# Patient Record
Sex: Female | Born: 1965 | Race: Black or African American | Hispanic: No | Marital: Single | Smoking: Never smoker
Health system: Southern US, Community
[De-identification: ages and names within clinical notes are randomized; demographics above are authoritative.]

## PROBLEM LIST (undated history)

## (undated) DIAGNOSIS — K219 Gastro-esophageal reflux disease without esophagitis: Secondary | ICD-10-CM

## (undated) DIAGNOSIS — R42 Dizziness and giddiness: Secondary | ICD-10-CM

## (undated) DIAGNOSIS — F32A Depression, unspecified: Secondary | ICD-10-CM

## (undated) DIAGNOSIS — F410 Panic disorder [episodic paroxysmal anxiety] without agoraphobia: Secondary | ICD-10-CM

## (undated) DIAGNOSIS — G43909 Migraine, unspecified, not intractable, without status migrainosus: Secondary | ICD-10-CM

## (undated) HISTORY — PX: UTERINE FIBROID SURGERY: SHX826

## (undated) HISTORY — PX: DILATION AND CURETTAGE OF UTERUS: SHX78

---

## 2011-09-08 ENCOUNTER — Emergency Department (HOSPITAL_BASED_OUTPATIENT_CLINIC_OR_DEPARTMENT_OTHER)
Admission: EM | Admit: 2011-09-08 | Discharge: 2011-09-08 | Disposition: A | Discharge status: Home / Self Care | Payer: Medicaid - Out of State | Attending: Emergency Medicine | Admitting: Emergency Medicine

## 2011-09-08 ENCOUNTER — Encounter (HOSPITAL_BASED_OUTPATIENT_CLINIC_OR_DEPARTMENT_OTHER): Payer: Self-pay | Admitting: Emergency Medicine

## 2011-09-08 ENCOUNTER — Emergency Department (HOSPITAL_BASED_OUTPATIENT_CLINIC_OR_DEPARTMENT_OTHER): Payer: Medicaid - Out of State

## 2011-09-08 DIAGNOSIS — R209 Unspecified disturbances of skin sensation: Secondary | ICD-10-CM | POA: Insufficient documentation

## 2011-09-08 DIAGNOSIS — R000 Tachycardia, unspecified: Secondary | ICD-10-CM | POA: Insufficient documentation

## 2011-09-08 DIAGNOSIS — R079 Chest pain, unspecified: Secondary | ICD-10-CM

## 2011-09-08 DIAGNOSIS — F411 Generalized anxiety disorder: Secondary | ICD-10-CM | POA: Insufficient documentation

## 2011-09-08 LAB — CBC WITH DIFFERENTIAL/PLATELET
Basophils Relative: 0 % (ref 0–1)
Eosinophils Absolute: 0 10*3/uL (ref 0.0–0.7)
Eosinophils Relative: 0 % (ref 0–5)
Hemoglobin: 12.1 g/dL (ref 12.0–15.0)
Lymphs Abs: 1.4 10*3/uL (ref 0.7–4.0)
MCH: 31.5 pg (ref 26.0–34.0)
MCHC: 34.7 g/dL (ref 30.0–36.0)
MCV: 90.9 fL (ref 78.0–100.0)
Monocytes Relative: 6 % (ref 3–12)
Platelets: 353 10*3/uL (ref 150–400)
RBC: 3.84 MIL/uL — ABNORMAL LOW (ref 3.87–5.11)

## 2011-09-08 LAB — TROPONIN I: Troponin I: <0.3 ng/mL (ref <0.30)

## 2011-09-08 LAB — POCT I-STAT, CHEM 8
BUN: 9 mg/dL (ref 6–23)
Creatinine, Ser: 1 mg/dL (ref 0.50–1.10)
Potassium: 3.8 mEq/L (ref 3.5–5.1)
Sodium: 142 mEq/L (ref 135–145)

## 2011-09-08 MED ORDER — ASPIRIN 325 MG PO TABS
325.0000 mg | ORAL_TABLET | Freq: Once | ORAL | Status: AC
  Administered 2011-09-08: 325 mg via ORAL
  Filled 2011-09-08: qty 1

## 2011-09-08 MED ORDER — SODIUM CHLORIDE 0.9 % IV BOLUS (SEPSIS): 1000.0000 mL | Freq: Once | INTRAVENOUS | Status: DC

## 2011-09-08 NOTE — ED Notes (Signed)
Pt. Is resting with eyes closed when RN entered room.  Pt. Has no complaints of pain .

## 2011-09-08 NOTE — ED Notes (Signed)
Patient transported to X-ray 

## 2011-09-08 NOTE — ED Notes (Signed)
Pt states that she has been experiencing additional stress and anxiety recently.  She had a full cardiac work-up completed a year ago with no abnormal results.  She states that she is more aware of the pain when she becomes more anxious and stressed.

## 2011-09-08 NOTE — ED Notes (Signed)
Pt c/o chest pain with tingling in her left arm. Denies nausea or dizziness.  Has been experiencing chest pain intermittently for over a year.

## 2011-09-08 NOTE — ED Provider Notes (Addendum)
History     CSN: [id number]  Arrival date & time 09/08/11  0813   First MD Initiated Contact with Patient 09/08/11 [phone]      Chief Complaint  Patient presents with  . Chest Pain    (Consider location/radiation/quality/duration/timing/severity/associated sxs/prior treatment) The history is provided by the patient.   Janet Walsh is a 46 y.o. female hx of prediabetes here with chest pain. L sided chest pain intermittent for the last year. She was admitted in a hospital in [place] a year ago and had nl exercise stress test and nuclear stress test. Yesterday at night, she laid down and had L sided chest pain and palpitations. She also has numbness on L arm. Denies SOB or leg swelling. She has been traveling back and forth from [place] recently but denies hx of blood clots and is not using OCP. Her cousin died in her 35s from unknown cause while asleep but otherwise no family hx of CAD. She is not a smoker.   History reviewed. No pertinent past medical history.  Past Surgical History  Procedure Date  . Cesarean section 1998    No family history on file.  History  Substance Use Topics  . Smoking status: Never Smoker   . Smokeless tobacco: Not on file  . Alcohol Use: No    OB History    Grav Para Term Preterm Abortions TAB SAB Ect Mult Living                  Review of Systems  Cardiovascular: Positive for chest pain.  Neurological: Positive for numbness.  All other systems reviewed and are negative.    Allergies  Review of patient's allergies indicates not on file.  Home Medications  No current outpatient prescriptions on file.  BP 100/57  Pulse 72  Resp 16  SpO2 100%  LMP 08/15/2011  Physical Exam  Nursing note and vitals reviewed. Constitutional: She is oriented to person, place, and time. She appears well-developed and well-nourished.       Anxious, but NAD  HENT:  Head: Normocephalic and atraumatic.  Mouth/Throat: Oropharynx is clear and moist.    Eyes: Conjunctivae and EOM are normal. Pupils are equal, round, and reactive to light.  Neck: Normal range of motion. Neck supple.  Cardiovascular:       Tachy, no murmurs  Pulmonary/Chest: Effort normal and breath sounds normal.  Abdominal: Soft. Bowel sounds are normal.  Musculoskeletal: Normal range of motion.       No edema no calf tenderness  Neurological: She is alert and oriented to person, place, and time.  Skin: Skin is warm and dry.  Psychiatric: Her behavior is normal. Judgment and thought content normal.       Anxious     ED Course  Procedures (including critical care time)  Labs Reviewed  CBC WITH DIFFERENTIAL - Abnormal; Notable for the following:    RBC 3.84 (*)     HCT 34.9 (*)     All other components within normal limits  POCT I-STAT, CHEM 8 - Abnormal; Notable for the following:    Glucose, Bld 108 (*)     Calcium, Ion 1.26 (*)     All other components within normal limits  CK TOTAL AND CKMB  TROPONIN I  D-DIMER, QUANTITATIVE  TROPONIN I   Dg Chest 2 View  09/08/2011  *RADIOLOGY REPORT*  Clinical Data: Chest pain.  CHEST - 2 VIEW  Comparison: None.  Findings: Cardiac and mediastinal contours  appear normal.  The lungs appear clear.  No pleural effusion is identified.  IMPRESSION:  No significant abnormality identified.   Original Report Authenticated By: Dellia Cloud, M.D.      No diagnosis found.   Date: 09/08/2011  Rate: 100  Rhythm: sinus tachycardia  QRS Axis: normal  Intervals: normal  ST/T Wave abnormalities: nonspecific ST changes  Conduction Disutrbances:none  Narrative Interpretation:   Old EKG Reviewed: none available    MDM  Janet Walsh is a 46 y.o. female here with intermittent chest pain that got worse. EKG showed nonspecific changes. She is at risk for PE given her recent travels to [facility] and she is tachycardic. She is otherwise low risk for PE so d-dimer is appropriate. Will also check trop x 2. She had stress test a  year ago that is normal and she has atypical chest pain.   1:22 PM Reassessed patient. Pain free now. Labs showed CBC, Chem 8 at baseline. D-dimer neg. Trop neg x 2. Heart rate now in the 80s. Will d/c home with outpatient follow up.       Richardean Canal, MD 09/08/11 1323  Richardean Canal, MD 09/08/11 1324

## 2011-10-15 ENCOUNTER — Encounter (HOSPITAL_BASED_OUTPATIENT_CLINIC_OR_DEPARTMENT_OTHER): Payer: Self-pay | Admitting: Emergency Medicine

## 2011-10-15 ENCOUNTER — Emergency Department (HOSPITAL_BASED_OUTPATIENT_CLINIC_OR_DEPARTMENT_OTHER)
Admission: EM | Admit: 2011-10-15 | Discharge: 2011-10-15 | Disposition: A | Discharge status: Home / Self Care | Payer: Medicaid - Out of State | Attending: Emergency Medicine | Admitting: Emergency Medicine

## 2011-10-15 ENCOUNTER — Emergency Department (HOSPITAL_BASED_OUTPATIENT_CLINIC_OR_DEPARTMENT_OTHER): Payer: Medicaid - Out of State

## 2011-10-15 DIAGNOSIS — R42 Dizziness and giddiness: Secondary | ICD-10-CM

## 2011-10-15 DIAGNOSIS — R079 Chest pain, unspecified: Secondary | ICD-10-CM | POA: Insufficient documentation

## 2011-10-15 DIAGNOSIS — M542 Cervicalgia: Secondary | ICD-10-CM | POA: Insufficient documentation

## 2011-10-15 DIAGNOSIS — R51 Headache: Secondary | ICD-10-CM | POA: Insufficient documentation

## 2011-10-15 DIAGNOSIS — D649 Anemia, unspecified: Secondary | ICD-10-CM | POA: Insufficient documentation

## 2011-10-15 DIAGNOSIS — F411 Generalized anxiety disorder: Secondary | ICD-10-CM | POA: Insufficient documentation

## 2011-10-15 DIAGNOSIS — R5381 Other malaise: Secondary | ICD-10-CM | POA: Insufficient documentation

## 2011-10-15 DIAGNOSIS — R002 Palpitations: Secondary | ICD-10-CM | POA: Insufficient documentation

## 2011-10-15 DIAGNOSIS — R450 Nervousness: Secondary | ICD-10-CM | POA: Insufficient documentation

## 2011-10-15 DIAGNOSIS — R000 Tachycardia, unspecified: Secondary | ICD-10-CM | POA: Insufficient documentation

## 2011-10-15 DIAGNOSIS — K648 Other hemorrhoids: Secondary | ICD-10-CM | POA: Insufficient documentation

## 2011-10-15 DIAGNOSIS — R0602 Shortness of breath: Secondary | ICD-10-CM | POA: Insufficient documentation

## 2011-10-15 DIAGNOSIS — R209 Unspecified disturbances of skin sensation: Secondary | ICD-10-CM | POA: Insufficient documentation

## 2011-10-15 DIAGNOSIS — F419 Anxiety disorder, unspecified: Secondary | ICD-10-CM

## 2011-10-15 DIAGNOSIS — K644 Residual hemorrhoidal skin tags: Secondary | ICD-10-CM | POA: Insufficient documentation

## 2011-10-15 HISTORY — DX: Dizziness and giddiness: R42

## 2011-10-15 LAB — TROPONIN I: Troponin I: <0.3 ng/mL (ref <0.30)

## 2011-10-15 LAB — URINALYSIS, ROUTINE W REFLEX MICROSCOPIC
Glucose, UA: NEGATIVE mg/dL
Hgb urine dipstick: NEGATIVE
Specific Gravity, Urine: 1.008 (ref 1.005–1.030)
Urobilinogen, UA: 0.2 mg/dL (ref 0.0–1.0)

## 2011-10-15 LAB — CBC
Hemoglobin: 11.7 g/dL — ABNORMAL LOW (ref 12.0–15.0)
RBC: 3.73 MIL/uL — ABNORMAL LOW (ref 3.87–5.11)
WBC: 6.2 10*3/uL (ref 4.0–10.5)

## 2011-10-15 LAB — PREGNANCY, URINE: Preg Test, Ur: NEGATIVE

## 2011-10-15 LAB — BASIC METABOLIC PANEL
CO2: 26 mEq/L (ref 19–32)
Chloride: 100 mEq/L (ref 96–112)
GFR calc non Af Amer: 86 mL/min — ABNORMAL LOW (ref >90)
Glucose, Bld: 112 mg/dL — ABNORMAL HIGH (ref 70–99)
Potassium: 3.6 mEq/L (ref 3.5–5.1)
Sodium: 138 mEq/L (ref 135–145)

## 2011-10-15 LAB — OCCULT BLOOD X 1 CARD TO LAB, STOOL: Fecal Occult Bld: NEGATIVE

## 2011-10-15 NOTE — ED Notes (Signed)
Lightheaded and exhausted since getting up this morning.  Feels like heart is racing.

## 2011-10-15 NOTE — ED Provider Notes (Signed)
History     CSN: [id number]  Arrival date & time 10/15/11  1503   First MD Initiated Contact with Patient 10/15/11 1506      Chief Complaint  Patient presents with  . Dizziness  . Fatigue    (Consider location/radiation/quality/duration/timing/severity/associated sxs/prior treatment) HPI Comments: 46 y/o female presents to the ED complaining of lightheadedness and fatigue when she woke up this morning. She felt fine last night before going to bed. States she was laying in bed watching tv, and when she went to stand up she got lightheaded. Once she sat back down she felt fine. A little while later she was sitting in a chair, went to stand up and got lightheaded again, sat back down and felt fine. She has been eating and drinking well. Admits to associated fatigue when walking up steps and palpitations. Admits to being under increased stress and feeling anxious recently. Palpitations are constant as of this morning and describes it as "heart racing". She took an aspirin without any relief. She has had on and off left sided chest pain under her left breast radiating to the left side of her neck with intermittent numbness since 2011. She has had multiple workups and no one has been able to figure out what it is. States she has been diagnosed with both muscular chest pain and a pinched nerve. She is a non smoker. Has a history of vertigo and this lightheadedness is different. Normal menses.   The history is provided by the patient.    Past Medical History  Diagnosis Date  . Vertigo     Past Surgical History  Procedure Date  . Cesarean section 1998  . Dilation and curettage of uterus     No family history on file.  History  Substance Use Topics  . Smoking status: Never Smoker   . Smokeless tobacco: Not on file  . Alcohol Use: No    OB History    Grav Para Term Preterm Abortions TAB SAB Ect Mult Living                  Review of Systems  Constitutional: Positive for fatigue.  Negative for diaphoresis, activity change and appetite change.  HENT: Positive for neck pain. Negative for neck stiffness.   Eyes: Negative for visual disturbance.  Respiratory: Positive for shortness of breath. Negative for cough.   Cardiovascular: Positive for chest pain and palpitations.  Gastrointestinal: Negative for nausea, vomiting and abdominal pain.  Genitourinary: Negative for dysuria, difficulty urinating and menstrual problem.  Musculoskeletal: Negative for back pain and arthralgias.  Skin: Negative for color change and pallor.  Neurological: Positive for weakness, light-headedness and headaches. Negative for dizziness and syncope.  Psychiatric/Behavioral: Negative for confusion. The patient is nervous/anxious.     Allergies  Review of patient's allergies indicates no known allergies.  Home Medications  No current outpatient prescriptions on file.  BP 137/77  Pulse 107  Temp 99.1 F (37.3 C) (Oral)  Resp 16  Ht 5\' 4"  (1.626 m)  Wt 138 lb (62.596 kg)  BMI 23.69 kg/m2  SpO2 100%  LMP 10/07/2011  Physical Exam  Nursing note and vitals reviewed. Constitutional: She is oriented to person, place, and time. She appears well-developed and well-nourished. No distress.  HENT:  Head: Normocephalic and atraumatic.  Mouth/Throat: Oropharynx is clear and moist.  Eyes: Conjunctivae normal and EOM are normal. Pupils are equal, round, and reactive to light.  Neck: Normal range of motion. Neck supple. No spinous process  tenderness and no muscular tenderness present. No thyromegaly present.  Cardiovascular: Regular rhythm, normal heart sounds and intact distal pulses.  Tachycardia present.   Pulmonary/Chest: Effort normal and breath sounds normal.  Abdominal: Soft. Normal appearance and bowel sounds are normal. There is no tenderness. There is no CVA tenderness.  Genitourinary: Rectal exam shows external hemorrhoid (no evidence of thrombosis) and internal hemorrhoid. Rectal exam  shows no tenderness. Guaiac negative stool.  Musculoskeletal: Normal range of motion.  Neurological: She is alert and oriented to person, place, and time. She has normal strength. No cranial nerve deficit. She displays a negative Romberg sign. Coordination and gait normal.  Skin: Skin is warm, dry and intact. She is not diaphoretic. No pallor.  Psychiatric: Her speech is normal and behavior is normal. Thought content normal. Her mood appears anxious.    ED Course  Procedures (including critical care time)   Labs Reviewed  TROPONIN I  CBC  BASIC METABOLIC PANEL  URINALYSIS, ROUTINE W REFLEX MICROSCOPIC  PREGNANCY, URINE   Date: 10/15/2011  Rate: 96  Rhythm: normal sinus rhythm  QRS Axis: normal  Intervals: normal  ST/T Wave abnormalities: normal  Conduction Disutrbances:none  Narrative Interpretation: no stemi, normal ekg  Old EKG Reviewed: unchanged Results for orders placed during the hospital encounter of 10/15/11  TROPONIN I      Component Value Range   Troponin I <0.30  <0.30 ng/mL  CBC      Component Value Range   WBC 6.2  4.0 - 10.5 K/uL   RBC 3.73 (*) 3.87 - 5.11 MIL/uL   Hemoglobin 11.7 (*) 12.0 - 15.0 g/dL   HCT 16.1 (*) 09.6 - 04.5 %   MCV 92.8  78.0 - 100.0 fL   MCH 31.4  26.0 - 34.0 pg   MCHC 33.8  30.0 - 36.0 g/dL   RDW 40.9  81.1 - 91.4 %   Platelets 367  150 - 400 K/uL  BASIC METABOLIC PANEL      Component Value Range   Sodium 138  135 - 145 mEq/L   Potassium 3.6  3.5 - 5.1 mEq/L   Chloride 100  96 - 112 mEq/L   CO2 26  19 - 32 mEq/L   Glucose, Bld 112 (*) 70 - 99 mg/dL   BUN 10  6 - 23 mg/dL   Creatinine, Ser 7.82  0.50 - 1.10 mg/dL   Calcium 9.6  8.4 - 95.6 mg/dL   GFR calc non Af Amer 86 (*) >90 mL/min   GFR calc Af Amer >90  >90 mL/min  URINALYSIS, ROUTINE W REFLEX MICROSCOPIC      Component Value Range   Color, Urine YELLOW  YELLOW   APPearance CLEAR  CLEAR   Specific Gravity, Urine 1.008  1.005 - 1.030   pH 6.5  5.0 - 8.0   Glucose, UA  NEGATIVE  NEGATIVE mg/dL   Hgb urine dipstick NEGATIVE  NEGATIVE   Bilirubin Urine NEGATIVE  NEGATIVE   Ketones, ur NEGATIVE  NEGATIVE mg/dL   Protein, ur NEGATIVE  NEGATIVE mg/dL   Urobilinogen, UA 0.2  0.0 - 1.0 mg/dL   Nitrite NEGATIVE  NEGATIVE   Leukocytes, UA NEGATIVE  NEGATIVE  PREGNANCY, URINE      Component Value Range   Preg Test, Ur NEGATIVE  NEGATIVE  OCCULT BLOOD X 1 CARD TO LAB, STOOL      Component Value Range   Fecal Occult Bld NEGATIVE       Dg Chest 2 View  10/15/2011  *RADIOLOGY REPORT*  Clinical Data: Dizziness and fatigue  CHEST - 2 VIEW  Comparison: September 08, 2011  Findings:  Lungs are clear.  Heart size and pulmonary vascularity are normal.  No adenopathy.  There is minimal thoracic levoscoliosis.  IMPRESSION: Lungs clear.   Original Report Authenticated By: Arvin Collard. WOODRUFF III, M.D.      1. Lightheadedness   2. Anxiety   3. Anemia       MDM  46 y/o female with lightheadedness as of this morning. Orthostatics WNL. Unable to reciprocate symptoms. No acute findings on EKG, CXR, or labs explaining intermittent chest pain since 2011. Patient just moved here from [place] and has no PCP. Hgb lower today than in August- states she has been told she has low iron in the past. Occult stool card negative. Will discharge with instructions to take iron supplementation along with resource list for PCP follow up. Case discussed with Dr. Judd Lien who agrees with plan of care.        Trevor Mace, PA-C 10/15/11 1728

## 2011-10-15 NOTE — ED Notes (Addendum)
She reports her lightheadedness and exhausted feeling that began this morning. She feels anxious.

## 2011-10-16 NOTE — ED Provider Notes (Signed)
Medical screening examination/treatment/procedure(s) were performed by non-physician practitioner and as supervising physician I was immediately available for consultation/collaboration.  Geoffery Lyons, MD 10/16/11 1945

## 2012-03-29 ENCOUNTER — Emergency Department (HOSPITAL_BASED_OUTPATIENT_CLINIC_OR_DEPARTMENT_OTHER): Payer: Medicaid - Out of State

## 2012-03-29 ENCOUNTER — Emergency Department (HOSPITAL_BASED_OUTPATIENT_CLINIC_OR_DEPARTMENT_OTHER)
Admission: EM | Admit: 2012-03-29 | Discharge: 2012-03-30 | Disposition: A | Discharge status: Home / Self Care | Payer: Medicaid - Out of State | Attending: Emergency Medicine | Admitting: Emergency Medicine

## 2012-03-29 ENCOUNTER — Encounter (HOSPITAL_BASED_OUTPATIENT_CLINIC_OR_DEPARTMENT_OTHER): Payer: Self-pay | Admitting: *Deleted

## 2012-03-29 DIAGNOSIS — Z8669 Personal history of other diseases of the nervous system and sense organs: Secondary | ICD-10-CM | POA: Insufficient documentation

## 2012-03-29 DIAGNOSIS — F411 Generalized anxiety disorder: Secondary | ICD-10-CM | POA: Insufficient documentation

## 2012-03-29 DIAGNOSIS — R12 Heartburn: Secondary | ICD-10-CM | POA: Insufficient documentation

## 2012-03-29 DIAGNOSIS — Z79899 Other long term (current) drug therapy: Secondary | ICD-10-CM | POA: Insufficient documentation

## 2012-03-29 DIAGNOSIS — Z7982 Long term (current) use of aspirin: Secondary | ICD-10-CM | POA: Insufficient documentation

## 2012-03-29 DIAGNOSIS — Z3202 Encounter for pregnancy test, result negative: Secondary | ICD-10-CM | POA: Insufficient documentation

## 2012-03-29 DIAGNOSIS — R450 Nervousness: Secondary | ICD-10-CM | POA: Insufficient documentation

## 2012-03-29 DIAGNOSIS — F419 Anxiety disorder, unspecified: Secondary | ICD-10-CM

## 2012-03-29 DIAGNOSIS — K219 Gastro-esophageal reflux disease without esophagitis: Secondary | ICD-10-CM | POA: Insufficient documentation

## 2012-03-29 HISTORY — DX: Gastro-esophageal reflux disease without esophagitis: K21.9

## 2012-03-29 HISTORY — DX: Panic disorder (episodic paroxysmal anxiety): F41.0

## 2012-03-29 LAB — CBC WITH DIFFERENTIAL/PLATELET
Basophils Absolute: 0 10*3/uL (ref 0.0–0.1)
Eosinophils Relative: 0 % (ref 0–5)
HCT: 34.4 % — ABNORMAL LOW (ref 36.0–46.0)
Lymphocytes Relative: 28 % (ref 12–46)
Lymphs Abs: 2 10*3/uL (ref 0.7–4.0)
MCV: 93.5 fL (ref 78.0–100.0)
Monocytes Absolute: 0.8 10*3/uL (ref 0.1–1.0)
Neutro Abs: 4.2 10*3/uL (ref 1.7–7.7)
Platelets: 392 10*3/uL (ref 150–400)
RBC: 3.68 MIL/uL — ABNORMAL LOW (ref 3.87–5.11)
RDW: 13.1 % (ref 11.5–15.5)
WBC: 7 10*3/uL (ref 4.0–10.5)

## 2012-03-29 LAB — BASIC METABOLIC PANEL
CO2: 25 mEq/L (ref 19–32)
Chloride: 101 mEq/L (ref 96–112)
Glucose, Bld: 129 mg/dL — ABNORMAL HIGH (ref 70–99)
Sodium: 137 mEq/L (ref 135–145)

## 2012-03-29 LAB — TROPONIN I: Troponin I: <0.3 ng/mL (ref <0.30)

## 2012-03-29 LAB — D-DIMER, QUANTITATIVE: D-Dimer, Quant: <0.27 ug/mL-FEU (ref 0.00–0.48)

## 2012-03-29 MED ORDER — LORAZEPAM 2 MG/ML IJ SOLN
0.5000 mg | Freq: Once | INTRAMUSCULAR | Status: AC
  Administered 2012-03-29: 0.5 mg via INTRAVENOUS
  Filled 2012-03-29: qty 1

## 2012-03-29 MED ORDER — GI COCKTAIL ~~LOC~~
30.0000 mL | Freq: Once | ORAL | Status: AC
  Administered 2012-03-29: 30 mL via ORAL
  Filled 2012-03-29: qty 30

## 2012-03-29 NOTE — ED Notes (Signed)
Pt reports unable to void at this time.

## 2012-03-29 NOTE — ED Notes (Signed)
MD at bedside. 

## 2012-03-29 NOTE — ED Provider Notes (Signed)
History     CSN: [id number]  Arrival date & time 03/29/12  2243   First MD Initiated Contact with Patient 03/29/12 2259      Chief Complaint  Patient presents with  . Chest Pain    (Consider location/radiation/quality/duration/timing/severity/associated sxs/prior treatment) Patient is a 47 y.o. female presenting with chest pain. The history is provided by the patient. No language interpreter was used.  Chest Pain Pain location:  L chest Pain quality: burning   Pain radiates to:  Does not radiate Pain radiates to the back: no   Pain severity:  Moderate Onset quality:  Gradual Duration:  13 hours Timing:  Constant Progression:  Unchanged Chronicity:  Recurrent Context: stress   Relieved by:  Nothing Worsened by:  Nothing tried Ineffective treatments:  None tried Associated symptoms: no back pain, no claudication, no fever, no lower extremity edema, no nausea, no PND, no shortness of breath, not vomiting and no weakness   Risk factors: no smoking     Past Medical History  Diagnosis Date  . Vertigo   . GERD (gastroesophageal reflux disease)   . Panic attacks     Past Surgical History  Procedure Laterality Date  . Cesarean section  1998  . Dilation and curettage of uterus      History reviewed. No pertinent family history.  History  Substance Use Topics  . Smoking status: Never Smoker   . Smokeless tobacco: Not on file  . Alcohol Use: No    OB History   Grav Para Term Preterm Abortions TAB SAB Ect Mult Living                  Review of Systems  Constitutional: Negative for fever.  Respiratory: Negative for shortness of breath.   Cardiovascular: Positive for chest pain. Negative for claudication and PND.  Gastrointestinal: Negative for nausea and vomiting.  Musculoskeletal: Negative for back pain.  Neurological: Negative for weakness.  Psychiatric/Behavioral: The patient is nervous/anxious.   All other systems reviewed and are  negative.    Allergies  Review of patient's allergies indicates no known allergies.  Home Medications   Current Outpatient Rx  Name  Route  Sig  Dispense  Refill  . aspirin 81 MG tablet   Oral   Take 81 mg by mouth daily.         Marland Kitchen LORazepam (ATIVAN) 1 MG tablet   Oral   Take 1 mg by mouth every 6 (six) hours as needed for anxiety.         Marland Kitchen omeprazole (PRILOSEC) 20 MG capsule   Oral   Take 20 mg by mouth daily.           BP 149/93  Temp(Src) 98.9 F (37.2 C) (Oral)  Wt 138 lb (62.596 kg)  BMI 23.68 kg/m2  SpO2 100%  Physical Exam  Constitutional: She is oriented to person, place, and time. She appears well-developed and well-nourished. No distress.  HENT:  Mouth/Throat: Oropharynx is clear and moist.  Eyes: Conjunctivae are normal. Pupils are equal, round, and reactive to light.  Neck: Normal range of motion. Neck supple.  Cardiovascular: Normal rate, regular rhythm and intact distal pulses.   Pulmonary/Chest: Effort normal and breath sounds normal. No respiratory distress. She has no wheezes. She has no rales. She exhibits no tenderness.  Abdominal: Soft. Bowel sounds are normal. There is no tenderness. There is no rebound and no guarding.  Musculoskeletal: Normal range of motion.  Neurological: She is alert and oriented  to person, place, and time.  Skin: Skin is warm and dry. She is not diaphoretic.  Psychiatric: Her mood appears anxious.    ED Course  Procedures (including critical care time)  Labs Reviewed  CBC WITH DIFFERENTIAL  BASIC METABOLIC PANEL  TROPONIN I  D-DIMER, QUANTITATIVE   No results found.   No diagnosis found.    MDM   Date: 03/30/2012  Rate: 118  Rhythm: sinus tachycardia  QRS Axis: normal  Intervals: normal  ST/T Wave abnormalities: nonspecific ST changes  Conduction Disutrbances:none  Narrative Interpretation:   Old EKG Reviewed: changes noted  Recent negative nuc stress test.  In the setting of > 8 hours of  ongoing symptoms with negative EKG and troponin ACS is excluded.   Symptoms completely relieved with GI cocktail.  Patient not taking her prilosec.  Symptoms are consistent with GERD that patient has made worsening by panicking.  Will prescribe carafate and refer to GI.  Return for chest pain shortness of breath nausea sweatiness or any concerns.  Follow up with your family doctor patient verbalizes understanding and agrees to follow up       Teale Goodgame Smitty Cords, MD 03/30/12 [phone]

## 2012-03-29 NOTE — ED Notes (Signed)
C/o intermittent chest pain for few days. Pt denies any n/v, no SOB, or diaphoresis. Pt has been seen for same in the past and dx'd with panic attacks.

## 2012-03-30 MED ORDER — SUCRALFATE 1 GM/10ML PO SUSP: 1.0000 g | Freq: Four times a day (QID) | ORAL | Status: DC

## 2012-03-30 MED ORDER — SODIUM CHLORIDE 0.9 % IV BOLUS (SEPSIS)
500.0000 mL | Freq: Once | INTRAVENOUS | Status: AC
  Administered 2012-03-29: 500 mL via INTRAVENOUS

## 2012-03-30 NOTE — ED Notes (Signed)
MD at bedside. 

## 2012-03-30 NOTE — ED Notes (Signed)
Patient transported to X-ray 

## 2013-08-13 ENCOUNTER — Ambulatory Visit (HOSPITAL_COMMUNITY)
Admission: RE | Admit: 2013-08-13 | Discharge: 2013-08-13 | Disposition: A | Discharge status: Home / Self Care | Payer: 59 | Source: Ambulatory Visit | Attending: Emergency Medicine | Admitting: Emergency Medicine

## 2013-08-22 ENCOUNTER — Emergency Department (HOSPITAL_BASED_OUTPATIENT_CLINIC_OR_DEPARTMENT_OTHER)
Admission: EM | Admit: 2013-08-22 | Discharge: 2013-08-22 | Disposition: A | Discharge status: Home / Self Care | Payer: 59 | Attending: Emergency Medicine | Admitting: Emergency Medicine

## 2013-08-22 ENCOUNTER — Encounter (HOSPITAL_BASED_OUTPATIENT_CLINIC_OR_DEPARTMENT_OTHER): Payer: Self-pay | Admitting: Emergency Medicine

## 2013-08-22 DIAGNOSIS — R109 Unspecified abdominal pain: Secondary | ICD-10-CM | POA: Insufficient documentation

## 2013-08-22 DIAGNOSIS — K219 Gastro-esophageal reflux disease without esophagitis: Secondary | ICD-10-CM | POA: Insufficient documentation

## 2013-08-22 DIAGNOSIS — R110 Nausea: Secondary | ICD-10-CM | POA: Diagnosis not present

## 2013-08-22 DIAGNOSIS — F410 Panic disorder [episodic paroxysmal anxiety] without agoraphobia: Secondary | ICD-10-CM | POA: Insufficient documentation

## 2013-08-22 DIAGNOSIS — Z79899 Other long term (current) drug therapy: Secondary | ICD-10-CM | POA: Insufficient documentation

## 2013-08-22 DIAGNOSIS — R197 Diarrhea, unspecified: Secondary | ICD-10-CM | POA: Diagnosis not present

## 2013-08-22 DIAGNOSIS — Z3202 Encounter for pregnancy test, result negative: Secondary | ICD-10-CM | POA: Insufficient documentation

## 2013-08-22 LAB — CBC WITH DIFFERENTIAL/PLATELET
Basophils Absolute: 0 10*3/uL (ref 0.0–0.1)
Basophils Relative: 0 % (ref 0–1)
Eosinophils Absolute: 0 10*3/uL (ref 0.0–0.7)
Eosinophils Relative: 0 % (ref 0–5)
HCT: 35.6 % — ABNORMAL LOW (ref 36.0–46.0)
HEMOGLOBIN: 11.9 g/dL — AB (ref 12.0–15.0)
LYMPHS ABS: 0.7 10*3/uL (ref 0.7–4.0)
Lymphocytes Relative: 10 % — ABNORMAL LOW (ref 12–46)
MCH: 31.5 pg (ref 26.0–34.0)
MCHC: 33.4 g/dL (ref 30.0–36.0)
MCV: 94.2 fL (ref 78.0–100.0)
MONOS PCT: 9 % (ref 3–12)
Monocytes Absolute: 0.6 10*3/uL (ref 0.1–1.0)
NEUTROS ABS: 5.5 10*3/uL (ref 1.7–7.7)
NEUTROS PCT: 81 % — AB (ref 43–77)
PLATELETS: 336 10*3/uL (ref 150–400)
RBC: 3.78 MIL/uL — AB (ref 3.87–5.11)
RDW: 14 % (ref 11.5–15.5)
WBC: 6.7 10*3/uL (ref 4.0–10.5)

## 2013-08-22 LAB — URINALYSIS, ROUTINE W REFLEX MICROSCOPIC
BILIRUBIN URINE: NEGATIVE
GLUCOSE, UA: NEGATIVE mg/dL
KETONES UR: NEGATIVE mg/dL
Leukocytes, UA: NEGATIVE
Nitrite: NEGATIVE
PH: 6 (ref 5.0–8.0)
Protein, ur: NEGATIVE mg/dL
Specific Gravity, Urine: 1.023 (ref 1.005–1.030)
Urobilinogen, UA: 0.2 mg/dL (ref 0.0–1.0)

## 2013-08-22 LAB — URINE MICROSCOPIC-ADD ON

## 2013-08-22 LAB — COMPREHENSIVE METABOLIC PANEL
ALBUMIN: 3.7 g/dL (ref 3.5–5.2)
ALK PHOS: 40 U/L (ref 39–117)
ALT: 10 U/L (ref 0–35)
ANION GAP: 15 (ref 5–15)
AST: 19 U/L (ref 0–37)
BILIRUBIN TOTAL: 0.3 mg/dL (ref 0.3–1.2)
BUN: 6 mg/dL (ref 6–23)
CHLORIDE: 101 meq/L (ref 96–112)
CO2: 22 mEq/L (ref 19–32)
Calcium: 9.7 mg/dL (ref 8.4–10.5)
Creatinine, Ser: 0.8 mg/dL (ref 0.50–1.10)
GFR calc Af Amer: >90 mL/min (ref >90)
GFR calc non Af Amer: 85 mL/min — ABNORMAL LOW (ref >90)
GLUCOSE: 128 mg/dL — AB (ref 70–99)
POTASSIUM: 3.8 meq/L (ref 3.7–5.3)
SODIUM: 138 meq/L (ref 137–147)
Total Protein: 7.9 g/dL (ref 6.0–8.3)

## 2013-08-22 LAB — PREGNANCY, URINE: Preg Test, Ur: NEGATIVE

## 2013-08-22 LAB — LIPASE, BLOOD: Lipase: 21 U/L (ref 11–59)

## 2013-08-22 MED ORDER — SODIUM CHLORIDE 0.9 % IV BOLUS (SEPSIS)
1000.0000 mL | Freq: Once | INTRAVENOUS | Status: AC
  Administered 2013-08-22: 1000 mL via INTRAVENOUS

## 2013-08-22 MED ORDER — ONDANSETRON HCL 4 MG/2ML IJ SOLN
4.0000 mg | Freq: Once | INTRAMUSCULAR | Status: AC
  Administered 2013-08-22: 4 mg via INTRAVENOUS
  Filled 2013-08-22: qty 2

## 2013-08-22 NOTE — ED Provider Notes (Signed)
CSN: [id number]     Arrival date & time 08/22/13  1937 History   First MD Initiated Contact with Patient 08/22/13 2149     This chart was scribed for Ethelda ChickMartha K Linker, MD by Arlan OrganAshley Leger, ED Scribe. This patient was seen in room MH06/MH06 and the patient's care was started 10:21 PM.   Chief Complaint  Patient presents with  . Diarrhea   The history is provided by the patient. No language interpreter was used.    HPI Comments: Janet Walsh is a 48 y.o. Female with a PMHx of GERD who presents to the Emergency Department complaining of intermittent, moderate diarrhea onset yesterday around 2:30 PM that is unchanged. She reports about 3 epsisodes of diarrhea today. Pt also reports associated intermittent, moderate abdominal cramping and nausea lasting 60 seconds with episodes. She has tried drinking some Gatorade and a dose of pepto bismol without any improvement for symptoms. At this time she denies any fever or chills. [place]. States she unaware of anyone she was traveling with mentioning similar symptoms. No known allergies to medications. No other concerns this visit. No feverchills.  No blood or mucous in stools.  No dizziness or fainting.  Denies urinary symptoms.  There are no other associated systemic symptoms, there are no other alleviating or modifying factors.   Past Medical History  Diagnosis Date  . Vertigo   . GERD (gastroesophageal reflux disease)   . Panic attacks    Past Surgical History  Procedure Laterality Date  . Cesarean section  1998  . Dilation and curettage of uterus     No family history on file. History  Substance Use Topics  . Smoking status: Never Smoker   . Smokeless tobacco: Not on file  . Alcohol Use: No   OB History   Grav Para Term Preterm Abortions TAB SAB Ect Mult Living                 Review of Systems  Constitutional: Negative for fever and chills.  Gastrointestinal: Positive for  nausea, abdominal pain and diarrhea. Negative for vomiting.   ROS reviewed and all otherwise negative except for mentioned in HPI   Allergies  Review of patient's allergies indicates no known allergies.  Home Medications   Prior to Admission medications   Medication Sig Start Date End Date Taking? Authorizing Provider  aspirin 81 MG tablet Take 81 mg by mouth daily.    Historical Provider, MD  LORazepam (ATIVAN) 1 MG tablet Take 1 mg by mouth every 6 (six) hours as needed for anxiety.    Historical Provider, MD  omeprazole (PRILOSEC) 20 MG capsule Take 20 mg by mouth daily.    Historical Provider, MD  sucralfate (CARAFATE) 1 GM/10ML suspension Take 10 mLs (1 g total) by mouth 4 (four) times daily. 03/30/12   April K Palumbo-Rasch, MD   Triage Vitals: BP 110/67  Pulse 84  Temp(Src) 98.5 F (36.9 C) (Oral)  Resp 16  Ht 5\' 4"  (1.626 m)  Wt 140 lb (63.504 kg)  BMI 24.02 kg/m2  SpO2 100%  LMP 08/07/2013   Physical Exam  Nursing note and vitals reviewed. Constitutional: She is oriented to person, place, and time. She appears well-developed and well-nourished.  HENT:  Head: Normocephalic.  Eyes: EOM are normal.  Neck: Normal range of motion.  Pulmonary/Chest: Effort normal.  Abdominal: She exhibits no distension.  Musculoskeletal: Normal range of motion.  Neurological: She is  alert and oriented to person, place, and time.  Psychiatric: She has a normal mood and affect.  note- MMM, no conjunctival injection, no scleral icterus, abdomen- nontender, nondistended, nabs, skin- no rash, normal skin turgor  ED Course  Procedures (including critical care time)  DIAGNOSTIC STUDIES: Oxygen Saturation is 100% on RA, Normal by my interpretation.    COORDINATION OF CARE: 10:27 PM- Will order stool culture, CBC, CMP, Lipase, urinalysis, and pregnancy urine. Discussed treatment plan with pt at bedside and pt agreed to plan.     Labs Review Labs Reviewed  URINALYSIS, ROUTINE W REFLEX  MICROSCOPIC - Abnormal; Notable for the following:    APPearance CLOUDY (*)    Hgb urine dipstick LARGE (*)    All other components within normal limits  URINE MICROSCOPIC-ADD ON - Abnormal; Notable for the following:    Squamous Epithelial / LPF FEW (*)    Bacteria, UA FEW (*)    All other components within normal limits  CBC WITH DIFFERENTIAL - Abnormal; Notable for the following:    RBC 3.78 (*)    Hemoglobin 11.9 (*)    HCT 35.6 (*)    Neutrophils Relative % 81 (*)    Lymphocytes Relative 10 (*)    All other components within normal limits  COMPREHENSIVE METABOLIC PANEL - Abnormal; Notable for the following:    Glucose, Bld 128 (*)    GFR calc non Af Amer 85 (*)    All other components within normal limits  STOOL CULTURE  OVA AND PARASITE EXAMINATION  URINE CULTURE  PREGNANCY, URINE  LIPASE, BLOOD    Imaging Review No results found.   EKG Interpretation None      MDM   Final diagnoses:  Diarrhea    Pt presenting with c/o onset of diarrhea with nausea which began yesterday.  Stools are liquid without blood or mucous.  She did recently travel from [place].  Urine reasssuring.  She was not able to provide stool culture- given specimen cup for home collection and will bring back tomorrow.  She feels improved after NS bolus.  Will hold on abx for now- await stool culture.   Patient is overall nontoxic and well hydrated in appearance.  Discharged with strict return precautions.  Pt agreeable with plan.  Nursing notes including past medical history and social history reviewed and considered in documentation    I personally performed the services described in this documentation, which was scribed in my presence. The recorded information has been reviewed and is accurate.    Ethelda Chick, MD 08/23/13 [phone]

## 2013-08-22 NOTE — ED Notes (Signed)
Returned from [place] today-c/o diarrhea, and abd cramps, nausea

## 2013-08-22 NOTE — Discharge Instructions (Signed)
Return to the ED with any concerns including vomiting and not able to keep down liquids, worsening abdominal pain- especially in the right lower abdomen, fever/chills, blood in stool, decreased level of alertness/lethargy, or any other alarming symptoms  You should bring a stool sample to the lab tomorrow for stool culture and stool for Ova and parasites

## 2013-08-23 DIAGNOSIS — R110 Nausea: Secondary | ICD-10-CM | POA: Diagnosis not present

## 2013-08-23 DIAGNOSIS — R197 Diarrhea, unspecified: Secondary | ICD-10-CM | POA: Diagnosis not present

## 2013-08-23 DIAGNOSIS — R109 Unspecified abdominal pain: Secondary | ICD-10-CM | POA: Diagnosis not present

## 2013-08-24 LAB — URINE CULTURE

## 2013-08-25 LAB — OVA AND PARASITE EXAMINATION

## 2013-08-27 LAB — STOOL CULTURE

## 2014-05-03 ENCOUNTER — Telehealth: Payer: Self-pay | Admitting: Interventional Cardiology

## 2014-05-03 NOTE — Telephone Encounter (Signed)
Returned pt call. Pt is requesting a new pt appt with Dr.Smith only as recommended by Okey Regalarol @ [facility]. She has been having ongoing chest pain,readiating left shoulder pain. She denies any other symptoms. Pt denies having cardiac risk factors. She is a non-smoker. She has frequent panic attacks. She sts that she has been trying for over a month to get in with Dr.Smith. Adv her I have only received 1 message regarding her rqst and that was yesterday afternoon.appt sch for 4/29 @ 8am. She thanked me for my assistance

## 2014-05-03 NOTE — Telephone Encounter (Signed)
New message         Pt is very distressed   She states Dr. Katrinka BlazingSmith told her that Misty StanleyLisa would be giving her a call to get her a New Pt appt asap   She states she called a month ago and yesterday   Please give pt a call

## 2014-05-11 ENCOUNTER — Ambulatory Visit (INDEPENDENT_AMBULATORY_CARE_PROVIDER_SITE_OTHER): Payer: 59 | Admitting: Interventional Cardiology

## 2014-05-11 ENCOUNTER — Encounter: Payer: Self-pay | Admitting: Interventional Cardiology

## 2014-05-11 VITALS — BP 122/72 | HR 70 | Ht 64.0 in | Wt 152.1 lb

## 2014-05-11 DIAGNOSIS — R002 Palpitations: Secondary | ICD-10-CM | POA: Diagnosis not present

## 2014-05-11 DIAGNOSIS — R0789 Other chest pain: Secondary | ICD-10-CM | POA: Insufficient documentation

## 2014-05-11 NOTE — Patient Instructions (Signed)
Medication Instructions:  Your physician recommends that you continue on your current medications as directed. Please refer to the Current Medication list given to you today.   Labwork: None   Testing/Procedures: Your physician has recommended that you wear an event monitor. Event monitors are medical devices that record the heart's electrical activity. Doctors most often us these monitors to diagnose arrhythmias. Arrhythmias are problems with the speed or rhythm of the heartbeat. The monitor is a small, portable device. You can wear one while you do your normal daily activities. This is usually used to diagnose what is causing palpitations/syncope (passing out).   Follow-Up: Your physician recommends that you schedule a follow-up appointment as needed   Any Other Special Instructions Will Be Listed Below (If Applicable).

## 2014-05-11 NOTE — Progress Notes (Signed)
Cardiology Office Note   Date:  05/11/2014   ID:  ZION LINT, DOB [birth year], MRN [id number]  PCP:  Almedia Balls, MD  Cardiologist:   Lesleigh Noe, MD   Chief Complaint  Patient presents with  . Chest Pain    left upper quadrant pain, left sided neck pain  . Panic Attack      History of Present Illness: Janet Walsh is a 49 y.o. female who presents for palpitations, and chest pressure. She is a single mother with a high school senior who will be going to [place] in the fall. She gives a history of recurring left subcostal to mid axillary line chest discomfort, sharp episodes of chest discomfort, occasional episodes of chest tightness, pounding and racing heart, neck discomfort, headache, blurred vision, and difficulty with sleep. These episodes have been occurring off and on since 2011. Cardiac evaluation has been done at least twice in the past, 2011 in 2015. She has had a stress myocardial perfusion study (2011) and a stress echo (2014), and was not found to have ischemia and has a structurally normal heart. She has stopped exercising because she is frightened. Physical activity does not produce any of these complaints. Her complaints occur spontaneously/randomly. She feels that she is under stress.    Past Medical History  Diagnosis Date  . Vertigo   . GERD (gastroesophageal reflux disease)   . Panic attacks     Past Surgical History  Procedure Laterality Date  . Cesarean section  1998  . Dilation and curettage of uterus       No current outpatient prescriptions on file.   No current facility-administered medications for this visit.    Allergies:   Review of patient's allergies indicates no known allergies.    Social History:  The patient  reports that she has never smoked. She does not have any smokeless tobacco history on file. She reports that she does not drink alcohol or use illicit drugs.   Family History:  The patient's family  history includes Heart disease in her maternal uncle; Hypertension in her mother.    ROS:  Please see the history of present illness.   Otherwise, review of systems are positive for weight gain, fear, tingling in her left arm, with a recent MRI of the neck and thoracic spine demonstrating a bulging disc in the right cervical area.   All other systems are reviewed and negative.    PHYSICAL EXAM: VS:  BP 122/72 mmHg  Pulse 70  Ht  (1.626 m)  Wt 152 lb 1.9 oz (69.001 kg)  BMI 26.10 kg/m2 , BMI Body mass index is 26.1 kg/(m^2). GEN: Well nourished, well developed, in no acute distress HEENT: normal Neck: no JVD, carotid bruits, or masses Cardiac: RRR; no murmurs, rubs, or gallops,no edema  Respiratory:  clear to auscultation bilaterally, normal work of breathing GI: soft, nontender, nondistended, + BS MS: no deformity or atrophy Skin: warm and dry, no rash Neuro:  Strength and sensation are intact Psych: euthymic mood, full affect   EKG:  EKG is ordered today. The ekg ordered today demonstrates normal tracing.   Recent Labs: 08/22/2013: ALT 10; BUN 6; Creatinine 0.80; Hemoglobin 11.9*; Platelets 336; Potassium 3.8; Sodium 138    Lipid Panel No results found for: CHOL, TRIG, HDL, CHOLHDL, VLDL, LDLCALC, LDLDIRECT    Wt Readings from Last 3 Encounters:  05/11/14 152 lb 1.9 oz (69.001 kg)  08/22/13 140 lb (63.504 kg)  03/29/12 138  lb (62.596 kg)      Other studies Reviewed: Additional studies/ records that were reviewed today include: Marland Kitchen. Multiple [facility] Review of the above records demonstrates: Documents suggests that a ZIO monitor was unremarkable, she had a structurally normal heart on stress echo without evidence of ischemia, and normal EKGs. No cardiovascular diagnosis is been made. While wearing the monitor she did not have any index complaints.   ASSESSMENT AND PLAN:  Palpitations - thirty-day continuous monitor  hoping to rule out PSVT/A. fib/A flutter  Chest Tightness: Not felt to be cardiac and has had at least 2 prior ischemic evaluations  Possible anxiety state: Encouraged aerobic activity      Current medicines are reviewed at length with the patient today.  The patient does not have concerns regarding medicines.  The following changes have been made:  She doesn't like to take medication. I recommended nonsteroidal anti-inflammatory therapy intermittently for neck and chest discomfort. Suggested GI evaluation to exclude reflux as a cause of chest tightness. We will do a 30 day monitor to exclude PSVT other form of arrhythmia that could be triggering some of the anxiety attacks. patient was reassured.   Labs/ tests ordered today include:  No orders of the defined types were placed in this encounter.   Primary physician [facility]  Disposition:   FU with HS in PRN.    Signed, Lesleigh NoeSMITH III,Isaiha Asare W, MD  05/11/2014 8:22 AM    [facility] [address], [place], [place]  [address] Phone: [phone]; Fax: [phone]

## 2014-05-14 ENCOUNTER — Encounter: Payer: Self-pay | Admitting: *Deleted

## 2014-05-14 ENCOUNTER — Ambulatory Visit (INDEPENDENT_AMBULATORY_CARE_PROVIDER_SITE_OTHER): Payer: 59

## 2014-05-14 DIAGNOSIS — R002 Palpitations: Secondary | ICD-10-CM

## 2014-05-14 NOTE — Progress Notes (Signed)
Patient ID: Janet Walsh, female   DOB: [birth year], [age] y.o.   MRN: [id number] [employer] 30 day cardiac event monitor applied to patient.

## 2014-07-02 ENCOUNTER — Telehealth: Payer: Self-pay

## 2014-08-01 NOTE — Telephone Encounter (Signed)
error 

## 2014-10-15 ENCOUNTER — Telehealth: Payer: Self-pay | Admitting: Interventional Cardiology

## 2014-10-15 NOTE — Telephone Encounter (Signed)
Returned pt call. lmtcb if assistance is still needed 

## 2014-10-15 NOTE — Telephone Encounter (Signed)
Returned pt call. Pt sts that she has been having repeat episode of back and jaw pain. Tingling in the arm. Pt sts that she feels that they are panic attacks. She has been having these episodes on and off since 2011. Pt sts that episodes resolve when she is able to sit, relax, and calm her self down. Pt cardiac work includes stress echo in 2014, event monitor in 2016, myoview in 2011, which all have been negative. Pt sts that she is currently asymptomatic.   Pt adv that I have discussed her symptoms, and her cardiac work up has been reviewed by Dr.Skains (DOD) who recommends that pt f/u with her pcp, since pt cardiac work up was negative.reassurance given to pt.  Pt sts that she is scheduled to see her pcp this week. Adv her to discuss her symptoms with him. Pt agreeable voiced appreciation for the call back and verbalized understanding.

## 2014-10-15 NOTE — Telephone Encounter (Signed)
Pt c/o of Chest Pain: STAT if CP now or developed within 24 hours  1. Are you having CP right now? No  2. Are you experiencing any other symptoms (ex. SOB, nausea, vomiting, sweating)? Tingling in left arm, Pain in back and jaw, Headache, Nervous feeling  3. How long have you been experiencing CP? Since last weak  4. Is your CP continuous or coming and going? Coming and going  5. Have you taken Nitroglycerin? No ?

## 2014-10-15 NOTE — Telephone Encounter (Signed)
F/u   Pt returning Janet Walsh's phone call. Please call back and discuss.

## 2014-11-08 ENCOUNTER — Ambulatory Visit: Payer: 59 | Admitting: Interventional Cardiology

## 2016-02-01 ENCOUNTER — Emergency Department (HOSPITAL_BASED_OUTPATIENT_CLINIC_OR_DEPARTMENT_OTHER): Payer: Managed Care, Other (non HMO)

## 2016-02-01 ENCOUNTER — Encounter (HOSPITAL_BASED_OUTPATIENT_CLINIC_OR_DEPARTMENT_OTHER): Payer: Self-pay | Admitting: Emergency Medicine

## 2016-02-01 DIAGNOSIS — K219 Gastro-esophageal reflux disease without esophagitis: Secondary | ICD-10-CM | POA: Insufficient documentation

## 2016-02-01 DIAGNOSIS — R072 Precordial pain: Secondary | ICD-10-CM | POA: Diagnosis present

## 2016-02-01 NOTE — ED Triage Notes (Addendum)
Pain behind left ear around 1900, felt a "pop" , pain to left shoulder but has now resolved and having tingling to left fingers. HX of same x 1 years ago , dx with a panic attack . Slight trembling noted to hands, c/o of nervousness at present and while sitting in triage c/o of tingling to right fingers as well.

## 2016-02-02 ENCOUNTER — Encounter (HOSPITAL_BASED_OUTPATIENT_CLINIC_OR_DEPARTMENT_OTHER): Payer: Self-pay | Admitting: Emergency Medicine

## 2016-02-02 ENCOUNTER — Emergency Department (HOSPITAL_BASED_OUTPATIENT_CLINIC_OR_DEPARTMENT_OTHER)
Admission: EM | Admit: 2016-02-02 | Discharge: 2016-02-02 | Disposition: A | Discharge status: Home / Self Care | Payer: Managed Care, Other (non HMO) | Attending: Emergency Medicine | Admitting: Emergency Medicine

## 2016-02-02 DIAGNOSIS — K219 Gastro-esophageal reflux disease without esophagitis: Secondary | ICD-10-CM

## 2016-02-02 LAB — BASIC METABOLIC PANEL
Anion gap: 8 (ref 5–15)
BUN: 12 mg/dL (ref 6–20)
CHLORIDE: 102 mmol/L (ref 101–111)
CO2: 28 mmol/L (ref 22–32)
CREATININE: 0.78 mg/dL (ref 0.44–1.00)
Calcium: 10.1 mg/dL (ref 8.9–10.3)
GFR calc non Af Amer: >60 mL/min (ref >60)
Glucose, Bld: 119 mg/dL — ABNORMAL HIGH (ref 65–99)
POTASSIUM: 4.1 mmol/L (ref 3.5–5.1)
SODIUM: 138 mmol/L (ref 135–145)

## 2016-02-02 LAB — CBC
HCT: 38.4 % (ref 36.0–46.0)
HEMOGLOBIN: 12.9 g/dL (ref 12.0–15.0)
MCH: 32.2 pg (ref 26.0–34.0)
MCHC: 33.6 g/dL (ref 30.0–36.0)
MCV: 95.8 fL (ref 78.0–100.0)
PLATELETS: 360 10*3/uL (ref 150–400)
RBC: 4.01 MIL/uL (ref 3.87–5.11)
RDW: 12.8 % (ref 11.5–15.5)
WBC: 7.9 10*3/uL (ref 4.0–10.5)

## 2016-02-02 LAB — TROPONIN I
Troponin I: <0.03 ng/mL (ref <0.03)
Troponin I: <0.03 ng/mL (ref <0.03)

## 2016-02-02 LAB — PREGNANCY, URINE: Preg Test, Ur: NEGATIVE

## 2016-02-02 MED ORDER — GI COCKTAIL ~~LOC~~
30.0000 mL | Freq: Once | ORAL | Status: AC
  Administered 2016-02-02: 30 mL via ORAL
  Filled 2016-02-02: qty 30

## 2016-02-02 MED ORDER — RANITIDINE HCL 150 MG PO TABS: 150.0000 mg | ORAL_TABLET | Freq: Two times a day (BID) | ORAL | 0 refills | Status: DC

## 2016-02-02 NOTE — ED Provider Notes (Signed)
[facility]   CSN: [id number] Arrival date & time: 02/01/16  2110     History   Chief Complaint Chief Complaint  Patient presents with  . Panic Attack    HPI Janet Walsh is a 51 y.o. female.  The history is provided by the patient.  Chest Pain   This is a new problem. The current episode started 6 to 12 hours ago. The problem occurs constantly. The problem has not changed since onset.The pain is associated with rest. The pain is present in the substernal region. The pain is mild. The quality of the pain is described as dull. The pain does not radiate. Exacerbated by: after eating chick fila and then started panicking and it got worse and she came in because her dog was upset and laying on her lap and she thought that might be a bad sign. Pertinent negatives include no abdominal pain, no back pain, no claudication, no cough, no diaphoresis, no exertional chest pressure, no hemoptysis, no nausea, no near-syncope, no palpitations, no PND, no shortness of breath, no sputum production, no syncope and no vomiting. She has tried nothing for the symptoms. The treatment provided no relief. There are no known risk factors.  Pertinent negatives for past medical history include no aneurysm and no MI.  Pertinent negatives for family medical history include: no early MI.  Procedure history is negative for cardiac catheterization.    Past Medical History:  Diagnosis Date  . GERD (gastroesophageal reflux disease)   . Panic attacks   . Vertigo     Patient Active Problem List   Diagnosis Date Noted  . Palpitations 05/11/2014  . Chest tightness 05/11/2014    Past Surgical History:  Procedure Laterality Date  . CESAREAN SECTION  1998  . DILATION AND CURETTAGE OF UTERUS      OB History    No data available       Home Medications    Prior to Admission medications   Not on File    Family History Family History  Problem Relation Age of Onset  .  Hypertension Mother   . Heart disease Maternal Uncle     Social History Social History  Substance Use Topics  . Smoking status: Never Smoker  . Smokeless tobacco: Never Used  . Alcohol use Yes     Comment: occasionally     Allergies   Patient has no known allergies.   Review of Systems Review of Systems  Constitutional: Negative for diaphoresis.  HENT: Negative for voice change.   Respiratory: Negative for cough, hemoptysis, sputum production and shortness of breath.   Cardiovascular: Positive for chest pain. Negative for palpitations, claudication, leg swelling, syncope, PND and near-syncope.  Gastrointestinal: Negative for abdominal pain, nausea and vomiting.  Musculoskeletal: Negative for back pain.  All other systems reviewed and are negative.    Physical Exam Updated Vital Signs BP 134/73   Pulse 68   Temp 98.1 F (36.7 C) (Oral)   Resp 19   Ht 5\' 4"  (1.626 m)   Wt 161 lb (73 kg)   LMP 01/18/2016   SpO2 99%   BMI 27.64 kg/m   Physical Exam  Constitutional: She is oriented to person, place, and time. She appears well-developed and well-nourished. No distress.  HENT:  Head: Normocephalic and atraumatic.  Mouth/Throat: No oropharyngeal exudate.  Eyes: EOM are normal. Pupils are equal, round, and reactive to light.  Neck: Normal range of motion. Neck supple. No JVD present.  No tracheal deviation present.  Cardiovascular: Normal rate, regular rhythm and intact distal pulses.   Pulmonary/Chest: Effort normal and breath sounds normal. No stridor. She has no wheezes. She has no rales.  Abdominal: Soft. Bowel sounds are normal. She exhibits no mass. There is no tenderness. There is no rebound and no guarding.  Musculoskeletal: Normal range of motion. She exhibits no tenderness or deformity.  Lymphadenopathy:    She has no cervical adenopathy.  Neurological: She is alert and oriented to person, place, and time.  Skin: Capillary refill takes less than 2 seconds.    Psychiatric: Her mood appears anxious.     ED Treatments / Results   Vitals:   02/02/16 0340 02/02/16 0558  BP:  123/80  Pulse:  74  Resp:  16  Temp: 98.1 F (36.7 C)    Results for orders placed or performed during the hospital encounter of 02/02/16  Basic metabolic panel  Result Value Ref Range   Sodium 138 135 - 145 mmol/L   Potassium 4.1 3.5 - 5.1 mmol/L   Chloride 102 101 - 111 mmol/L   CO2 28 22 - 32 mmol/L   Glucose, Bld 119 (H) 65 - 99 mg/dL   BUN 12 6 - 20 mg/dL   Creatinine, Ser 2.13 0.44 - 1.00 mg/dL   Calcium 08.6 8.9 - 57.8 mg/dL   GFR calc non Af Amer >60 >60 mL/min   GFR calc Af Amer >60 >60 mL/min   Anion gap 8 5 - 15  CBC  Result Value Ref Range   WBC 7.9 4.0 - 10.5 K/uL   RBC 4.01 3.87 - 5.11 MIL/uL   Hemoglobin 12.9 12.0 - 15.0 g/dL   HCT 46.9 62.9 - 52.8 %   MCV 95.8 78.0 - 100.0 fL   MCH 32.2 26.0 - 34.0 pg   MCHC 33.6 30.0 - 36.0 g/dL   RDW 41.3 24.4 - 01.0 %   Platelets 360 150 - 400 K/uL  Troponin I  Result Value Ref Range   Troponin I <0.03 <0.03 ng/mL  Troponin I  Result Value Ref Range   Troponin I <0.03 <0.03 ng/mL   Dg Chest 2 View  Result Date: 02/01/2016 CLINICAL DATA:  Acute onset chest pain and bilateral arm paresthesias this morning. EXAM: CHEST  2 VIEW COMPARISON:  11/01/2014 FINDINGS: The heart size and mediastinal contours are within normal limits. Both lungs are clear. The visualized skeletal structures are unremarkable. IMPRESSION: No active cardiopulmonary disease. Electronically Signed   By: Myles Rosenthal M.D.   On: 02/01/2016 22:32    EKG  EKG Interpretation  Date/Time:  Saturday February 01 2016 21:23:01 EST Ventricular Rate:  86 PR Interval:  142 QRS Duration: 84 QT Interval:  354 QTC Calculation: 423 R Axis:   58 Text Interpretation:  Normal sinus rhythm Nonspecific T wave abnormality Abnormal ECG since last tracing no significant change Confirmed by BELFI  MD, MELANIE ([id number]) on 02/01/2016 9:28:07 PM        Radiology Dg Chest 2 View  Result Date: 02/01/2016 CLINICAL DATA:  Acute onset chest pain and bilateral arm paresthesias this morning. EXAM: CHEST  2 VIEW COMPARISON:  11/01/2014 FINDINGS: The heart size and mediastinal contours are within normal limits. Both lungs are clear. The visualized skeletal structures are unremarkable. IMPRESSION: No active cardiopulmonary disease. Electronically Signed   By: Myles Rosenthal M.D.   On: 02/01/2016 22:32    Procedures Procedures (including critical care time)  Medications Ordered in ED Medications  gi cocktail (Maalox,Lidocaine,Donnatal) (not administered)      Final Clinical Impressions(s) / ED Diagnoses  GERD with superimposed panic: ruled out for MI in the ED heart score is 1 extremely low risk for MACE.  PERC negative wells 0 highly doubt PE in this very low risk patient.  All questions answered to patient's satisfaction. Based on history and exam patient has been appropriately medically screened and emergency conditions excluded. Patient is stable for discharge at this time. Strict return precautions given for any further episodes, persistent fever, weakness or any concerns. New Prescriptions New Prescriptions   No medications on file  zantac   Carry Ortez, MD 02/02/16 [phone]

## 2017-03-16 ENCOUNTER — Emergency Department (HOSPITAL_BASED_OUTPATIENT_CLINIC_OR_DEPARTMENT_OTHER): Payer: 59

## 2017-03-16 ENCOUNTER — Encounter (HOSPITAL_BASED_OUTPATIENT_CLINIC_OR_DEPARTMENT_OTHER): Payer: Self-pay | Admitting: Emergency Medicine

## 2017-03-16 ENCOUNTER — Other Ambulatory Visit: Payer: Self-pay

## 2017-03-16 ENCOUNTER — Emergency Department (HOSPITAL_BASED_OUTPATIENT_CLINIC_OR_DEPARTMENT_OTHER)
Admission: EM | Admit: 2017-03-16 | Discharge: 2017-03-16 | Disposition: A | Discharge status: Home / Self Care | Payer: 59 | Attending: Emergency Medicine | Admitting: Emergency Medicine

## 2017-03-16 DIAGNOSIS — R0789 Other chest pain: Secondary | ICD-10-CM | POA: Diagnosis present

## 2017-03-16 HISTORY — DX: Migraine, unspecified, not intractable, without status migrainosus: G43.909

## 2017-03-16 LAB — COMPREHENSIVE METABOLIC PANEL
ALT: 17 U/L (ref 14–54)
ANION GAP: 8 (ref 5–15)
AST: 24 U/L (ref 15–41)
Albumin: 4.3 g/dL (ref 3.5–5.0)
Alkaline Phosphatase: 50 U/L (ref 38–126)
BUN: 11 mg/dL (ref 6–20)
CHLORIDE: 102 mmol/L (ref 101–111)
CO2: 27 mmol/L (ref 22–32)
Calcium: 9.9 mg/dL (ref 8.9–10.3)
Creatinine, Ser: 0.93 mg/dL (ref 0.44–1.00)
GFR calc Af Amer: >60 mL/min (ref >60)
GFR calc non Af Amer: >60 mL/min (ref >60)
Glucose, Bld: 126 mg/dL — ABNORMAL HIGH (ref 65–99)
POTASSIUM: 3.8 mmol/L (ref 3.5–5.1)
SODIUM: 137 mmol/L (ref 135–145)
Total Bilirubin: 0.5 mg/dL (ref 0.3–1.2)
Total Protein: 8.2 g/dL — ABNORMAL HIGH (ref 6.5–8.1)

## 2017-03-16 LAB — CBC WITH DIFFERENTIAL/PLATELET
BASOS ABS: 0 10*3/uL (ref 0.0–0.1)
BASOS PCT: 0 %
EOS ABS: 0 10*3/uL (ref 0.0–0.7)
Eosinophils Relative: 0 %
HCT: 38 % (ref 36.0–46.0)
Hemoglobin: 12.6 g/dL (ref 12.0–15.0)
Lymphocytes Relative: 21 %
Lymphs Abs: 1.4 10*3/uL (ref 0.7–4.0)
MCH: 32.4 pg (ref 26.0–34.0)
MCHC: 33.2 g/dL (ref 30.0–36.0)
MCV: 97.7 fL (ref 78.0–100.0)
MONO ABS: 0.5 10*3/uL (ref 0.1–1.0)
MONOS PCT: 7 %
NEUTROS PCT: 72 %
Neutro Abs: 4.8 10*3/uL (ref 1.7–7.7)
Platelets: 360 10*3/uL (ref 150–400)
RBC: 3.89 MIL/uL (ref 3.87–5.11)
RDW: 12.3 % (ref 11.5–15.5)
WBC: 6.7 10*3/uL (ref 4.0–10.5)

## 2017-03-16 LAB — TROPONIN I: Troponin I: <0.03 ng/mL (ref <0.03)

## 2017-03-16 MED ORDER — SIMETHICONE 80 MG PO CHEW: CHEWABLE_TABLET | ORAL | 0 refills | Status: DC

## 2017-03-16 MED ORDER — PANTOPRAZOLE SODIUM 40 MG PO TBEC
DELAYED_RELEASE_TABLET | ORAL | Status: AC
  Filled 2017-03-16: qty 1

## 2017-03-16 MED ORDER — GI COCKTAIL ~~LOC~~
30.0000 mL | Freq: Once | ORAL | Status: AC
  Administered 2017-03-16: 30 mL via ORAL
  Filled 2017-03-16: qty 30

## 2017-03-16 MED ORDER — PANTOPRAZOLE SODIUM 20 MG PO TBEC
20.0000 mg | DELAYED_RELEASE_TABLET | Freq: Once | ORAL | Status: DC
  Filled 2017-03-16: qty 1

## 2017-03-16 MED ORDER — PANTOPRAZOLE SODIUM 40 MG PO TBEC
40.0000 mg | DELAYED_RELEASE_TABLET | Freq: Once | ORAL | Status: AC
Start: 2017-03-16 — End: 2017-03-16
  Administered 2017-03-16: 40 mg via ORAL

## 2017-03-16 MED ORDER — PANTOPRAZOLE SODIUM 20 MG PO TBEC: 20.0000 mg | DELAYED_RELEASE_TABLET | Freq: Every day | ORAL | 0 refills | Status: DC

## 2017-03-16 NOTE — ED Triage Notes (Signed)
Pt c/o CP intermittently x 1 wk; mid chest, heaviness with radiation to middle back

## 2017-03-16 NOTE — ED Notes (Signed)
Attempted IV x1; able to obtain labs, but IV infiltrated.

## 2017-03-16 NOTE — ED Provider Notes (Signed)
[facility] EMERGENCY DEPARTMENT Provider Note   CSN: [id number] Arrival date & time: 03/16/17  1911     History   Chief Complaint Chief Complaint  Patient presents with  . Chest Pain    HPI Janet Walsh is a 52 y.o. female.  HPI Patient has noted off and on feeling of central burning in pressure in her chest and epigastrium.  Intermittently this is radiated through to her back.  She has had reflux in the past and thus has been trying antacids and drinking carbonated beverages.  Sometimes this is been helpful.  No associated symptoms of shortness of breath lightheadedness or syncope.  She does describe significant workup in 2016 for similar symptoms.  She had stress test and an event monitor.  She has no known cardiac disease.  She has not been having lower extremity pain or swelling. Past Medical History:  Diagnosis Date  . GERD (gastroesophageal reflux disease)   . Migraines   . Panic attacks   . Vertigo     Patient Active Problem List   Diagnosis Date Noted  . Palpitations 05/11/2014  . Chest tightness 05/11/2014    Past Surgical History:  Procedure Laterality Date  . CESAREAN SECTION  1998  . DILATION AND CURETTAGE OF UTERUS    . UTERINE FIBROID SURGERY      OB History    No data available       Home Medications    Prior to Admission medications   Medication Sig Start Date End Date Taking? Authorizing Provider  pantoprazole (PROTONIX) 20 MG tablet Take 1 tablet (20 mg total) by mouth daily. 03/16/17   Arby BarrettePfeiffer, Aliz Meritt, MD  ranitidine (ZANTAC) 150 MG tablet Take 1 tablet (150 mg total) by mouth 2 (two) times daily. 02/02/16   Palumbo, April, MD  simethicone (GAS-X) 80 MG chewable tablet 1 every 6 hours as needed for bloating or gas. 03/16/17   Arby BarrettePfeiffer, Cutter Passey, MD    Family History Family History  Problem Relation Age of Onset  . Hypertension Mother   . Heart disease Maternal Uncle     Social History Social History   Tobacco Use  .  Smoking status: Never Smoker  . Smokeless tobacco: Never Used  Substance Use Topics  . Alcohol use: Yes    Comment: occasionally  . Drug use: No     Allergies   Patient has no known allergies.   Review of Systems Review of Systems 10 Systems reviewed and are negative for acute change except as noted in the HPI.  Physical Exam Updated Vital Signs BP (!) 143/85   Pulse 94   Temp 98.6 F (37 C) (Oral)   Resp 15   Ht 5\' 4"  (1.626 m)   Wt 75.3 kg (166 lb)   SpO2 97%   BMI 28.49 kg/m   Physical Exam  Constitutional: She is oriented to person, place, and time. She appears well-developed and well-nourished. No distress.  HENT:  Head: Normocephalic and atraumatic.  Mouth/Throat: Oropharynx is clear and moist.  Eyes: Conjunctivae and EOM are normal.  Neck: Neck supple.  Cardiovascular: Normal rate, regular rhythm, normal heart sounds and intact distal pulses.  No murmur heard. Pulmonary/Chest: Effort normal and breath sounds normal. No respiratory distress.  Abdominal: Soft. She exhibits no distension. There is no tenderness. There is no guarding.  Musculoskeletal: Normal range of motion. She exhibits no edema or tenderness.  Neurological: She is alert and oriented to person, place, and time. No cranial nerve  deficit. She exhibits normal muscle tone. Coordination normal.  Skin: Skin is warm and dry.  Psychiatric: She has a normal mood and affect.  Nursing note and vitals reviewed.    ED Treatments / Results  Labs (all labs ordered are listed, but only abnormal results are displayed) Labs Reviewed  COMPREHENSIVE METABOLIC PANEL - Abnormal; Notable for the following components:      Result Value   Glucose, Bld 126 (*)    Total Protein 8.2 (*)    All other components within normal limits  CBC WITH DIFFERENTIAL/PLATELET  TROPONIN I    EKG  EKG Interpretation  Date/Time:  Tuesday March 16 2017 19:17:40 EST Ventricular Rate:  107 PR Interval:    QRS  Duration: 78 QT Interval:  311 QTC Calculation: 415 R Axis:   48 Text Interpretation:  Sinus tachycardia Abnormal R-wave progression, early transition Borderline repolarization abnormality Minimal ST elevation, lateral leads Baseline wander in lead(s) I II aVR aVF V2 inferior t wave inversion c/w old Confirmed by Arby Barrette [phone]) on 03/16/2017 7:32:15 PM       Radiology Dg Chest 2 View  Result Date: 03/16/2017 CLINICAL DATA:  Central chest pain radiating to back. EXAM: CHEST - 2 VIEW COMPARISON:  Chest radiograph February 01, 2016 FINDINGS: Cardiomediastinal silhouette is normal. No pleural effusions or focal consolidations. Trachea projects midline and there is no pneumothorax. Soft tissue planes and included osseous structures are non-suspicious. IMPRESSION: Negative. Electronically Signed   By: Awilda Metro M.D.   On: 03/16/2017 20:40    Procedures Procedures (including critical care time)  Medications Ordered in ED Medications  gi cocktail (Maalox,Lidocaine,Donnatal) (30 mLs Oral Given 03/16/17 2027)  pantoprazole (PROTONIX) EC tablet 40 mg (40 mg Oral Given 03/16/17 2027)     Initial Impression / Assessment and Plan / ED Course  I have reviewed the triage vital signs and the nursing notes.  Pertinent labs & imaging results that were available during my care of the patient were reviewed by me and considered in my medical decision making (see chart for details).      Final Clinical Impressions(s) / ED Diagnoses   Final diagnoses:  Atypical chest pain   Patient is clinically well in appearance.  She has been having symptoms periodically over the past week. At  times she is getting relief from positioning and treatment with antacid type preparation.  She however has not been taking a PPI regularly.  Patient had extensive cardiac evaluation when the past 2 years and does not have significant risk factors.  Cardiac enzymes are negative.  No signs of acute ischemic event.   Patient is already scheduled follow-up for tomorrow.  At this time I feel she is safe for discharge.  I will advise for continues PPI for approximately 2 weeks and follow-up as already scheduled. ED Discharge Orders        Ordered    pantoprazole (PROTONIX) 20 MG tablet  Daily     03/16/17 2106    simethicone (GAS-X) 80 MG chewable tablet     03/16/17 2106       Arby Barrette, MD 03/16/17 2112

## 2017-03-17 ENCOUNTER — Encounter: Payer: Self-pay | Admitting: Nurse Practitioner

## 2017-03-17 ENCOUNTER — Ambulatory Visit (INDEPENDENT_AMBULATORY_CARE_PROVIDER_SITE_OTHER): Payer: 59 | Admitting: Nurse Practitioner

## 2017-03-17 VITALS — BP 122/80 | HR 74 | Ht 64.0 in | Wt 165.4 lb

## 2017-03-17 DIAGNOSIS — R079 Chest pain, unspecified: Secondary | ICD-10-CM

## 2017-03-17 NOTE — Progress Notes (Signed)
CARDIOLOGY OFFICE NOTE  Date:  03/17/2017    Janet Walsh Date of Birth: 1965-03-29 Medical Record #[id number]  PCP:  Wilburn MylarKelly, Janet S, MD  Cardiologist:  Janet Walsh    Chief Complaint  Patient presents with  . Chest Pain    Work in visit - seen for Dr. Katrinka Walsh    History of Present Illness: Janet Walsh is a 52 y.o. female who presents today for a work in visit. Seen for Dr. Katrinka Walsh.   She was seen here back in April of 2016 for evaluation of palpitations and chest pain. Negative evaluation. Stress/GERD were felt to be the trigger. She had a prior stress echo in 2014 which was normal.   In the ER yesterday an approximate one week history of burning and pressure in the chest and epigastrium. Radiated to the back. Had been using antacids and drinking carbonated beverages with some improvement. Had negative evaluation. Was asked to follow up here.   Comes in today. Here alone. She notes that she has basically done ok up until the past week. She had acute onset of a crushing burning pain in the middle of her chest - radiated to the back - seemed to come and go and was not exertional. She thought it was GERD - had been making vegetable/fruit smoothies. Tried to burp without success. She tried to get in with GI but due to the back pain - they would not see her. By Monday she called her doctor - was referred to the Urgent Care - there she was told there was "a little abnormality on her EKG". She was sent home. She got angry. Went to a friend's house - then went on to the ER yesterday due to the possible abnormal EKG. Her symptoms were not as severe but continued to come and go. There in the ER she had a negative evaluation. Now just anxious. Has been able to burp and thinks this helps. Was given PPI RX but has not picked up yet. She does not know her cholesterol levels. Does not smoke. FH not positive for heart disease. She does not regularly exercise but wants to to lose weight. She has  not been short of breath.   Past Medical History:  Diagnosis Date  . GERD (gastroesophageal reflux disease)   . Migraines   . Panic attacks   . Vertigo     Past Surgical History:  Procedure Laterality Date  . CESAREAN SECTION  1998  . DILATION AND CURETTAGE OF UTERUS    . UTERINE FIBROID SURGERY       Medications: Current Meds  Medication Sig  . pantoprazole (PROTONIX) 20 MG tablet Take 1 tablet (20 mg total) by mouth daily.  . simethicone (GAS-X) 80 MG chewable tablet 1 every 6 hours as needed for bloating or gas.     Allergies: No Known Allergies  Social History: The patient  reports that  has never smoked. she has never used smokeless tobacco. She reports that she drinks alcohol. She reports that she does not use drugs.   Family History: The patient's family history includes Heart disease in her maternal uncle; High blood pressure in her sister; Hyperlipidemia in her mother; Hypertension in her mother.   Review of Systems: Please see the history of present illness.   Otherwise, the review of systems is positive for none.   All other systems are reviewed and negative.   Physical Exam: VS:  BP 122/80 (BP Location: Left Arm, Patient  Position: Sitting, Cuff Size: Normal)   Pulse 74   Ht 5\' 4"  (1.626 m)   Wt 165 lb 6.4 oz (75 kg)   BMI 28.39 kg/m  .  BMI Body mass index is 28.39 kg/m.  Wt Readings from Last 3 Encounters:  03/17/17 165 lb 6.4 oz (75 kg)  03/16/17 166 lb (75.3 kg)  02/01/16 161 lb (73 kg)    General: Pleasant. Very talkative. Alert and in no acute distress.   HEENT: Normal.  Neck: Supple, no JVD, carotid bruits, or masses noted.  Cardiac: Regular rate and rhythm. No murmurs, rubs, or gallops. No edema.  Respiratory:  Lungs are clear to auscultation bilaterally with normal work of breathing.  GI: Soft and nontender.  MS: No deformity or atrophy. Gait and ROM intact.  Skin: Warm and dry. Color is normal.  Neuro:  Strength and sensation are  intact and no gross focal deficits noted.  Psych: Alert, appropriate and with normal affect.   LABORATORY DATA:  EKG:  EKG is ordered today. This demonstrates NSR - reviewed with DR. Katrinka Blazing here in the office - no acute changes noted.  Lab Results  Component Value Date   WBC 6.7 03/16/2017   HGB 12.6 03/16/2017   HCT 38.0 03/16/2017   PLT 360 03/16/2017   GLUCOSE 126 (H) 03/16/2017   ALT 17 03/16/2017   AST 24 03/16/2017   NA 137 03/16/2017   K 3.8 03/16/2017   CL 102 03/16/2017   CREATININE 0.93 03/16/2017   BUN 11 03/16/2017   CO2 27 03/16/2017   Lab Results  Component Value Date   CKTOTAL 75 09/08/2011   CKMB 1.1 09/08/2011   TROPONINI <0.03 03/16/2017     BNP (last 3 results) No results for input(s): BNP in the last 8760 hours.  ProBNP (last 3 results) No results for input(s): PROBNP in the last 8760 hours.   Other Studies Reviewed Today:   Assessment/Plan:  1. Chest pain - atypical - discussed with Dr. Katrinka Blazing - she has had prior negative cardiac testing. Will arrange GXT. Recommend taking the PPI that was given from the ER for the next 2 weeks.   2. GERD - may need to get back to GI    Current medicines are reviewed with the patient today.  The patient does not have concerns regarding medicines other than what has been noted above.  The following changes have been made:  See above.  Labs/ tests ordered today include:    Orders Placed This Encounter  Procedures  . EKG 12-Lead     Disposition:   Further disposition pending.    Patient is agreeable to this plan and will call if any problems develop in the interim.   SignedNorma Fredrickson, NP  03/17/2017 2:33 PM  [facility] [address] [place], [place]  [address] Phone: [phone] Fax: [phone]

## 2017-03-17 NOTE — Patient Instructions (Addendum)
We will be checking the following labs today - NONE   Medication Instructions:    Continue with your current medicines.   Take the prescription given in the ER for the next 2 weeks    Testing/Procedures To Be Arranged:  GXT  Follow-Up:   Will see how your studies turn out    Other Special Instructions:   N/A    If you need a refill on your cardiac medications before your next appointment, please call your pharmacy.   Call the [facility] office at [phone] if you have any questions, problems or concerns.

## 2017-03-18 ENCOUNTER — Telehealth: Payer: Self-pay | Admitting: Interventional Cardiology

## 2017-03-18 NOTE — Telephone Encounter (Signed)
Received call transferred directly from operator and spoke with pt. She reports pain under left shoulder blade area.  Also has tingling under left upper arm and in left pinky finger. Was fine this AM but these symptoms started around 11 AM today.  Symptoms come and go.   She states these are the same symptoms she had when seen in ED.  Was also having these feelings when she saw Norma FredricksonLori Gerhardt, NP yesterday but pt reports she was calmer yesterday. She thinks symptoms may be related to anxiety. Having occasional hot flashes at night. Started pantoprazole this AM.  Also took Gas X.  Since taking Gas X she has been burping. She states she is trying to stay calm. Pt is asking about EKG done yesterday and I told her it showed no acute changes. I advised her to have GXT as planned.  I told her I would send to Dr. Katrinka BlazingSmith and we would call her back if he had any recommendations or wanted to make any changes.

## 2017-03-18 NOTE — Telephone Encounter (Signed)
New message     Pt c/o of Chest Pain: STAT if CP now or developed within 24 hours  1. Are you having CP right now? Having pain in chest, in her back , and left shoulder , and tingling in arm , started last night  2. Are you experiencing any other symptoms (ex. SOB, nausea, vomiting, sweating)? No, but last night she was having hot and cold flashes   3. How long have you been experiencing CP? This started last night , has been going on off and on for almost year   4. Is your CP continuous or coming and going?  Comes and goes   5. Have you taken Nitroglycerin? No started new medication the Lawson FiscalLori gave her this morning ?

## 2017-03-19 NOTE — Telephone Encounter (Signed)
This is most likely noncardiac.  Stress test is being done out of an abundance of precaution.

## 2017-03-30 ENCOUNTER — Ambulatory Visit (INDEPENDENT_AMBULATORY_CARE_PROVIDER_SITE_OTHER): Payer: 59

## 2017-03-30 DIAGNOSIS — R079 Chest pain, unspecified: Secondary | ICD-10-CM | POA: Diagnosis not present

## 2017-03-30 LAB — EXERCISE TOLERANCE TEST
Estimated workload: 7 METS
Exercise duration (min): 5 min
Exercise duration (sec): 37 s
MPHR: 167 {beats}/min
Peak HR: 179 {beats}/min
Percent HR: 107 %
RPE: 15
Rest HR: 77 {beats}/min

## 2019-07-06 ENCOUNTER — Other Ambulatory Visit: Payer: Self-pay

## 2019-07-06 ENCOUNTER — Encounter (HOSPITAL_COMMUNITY): Payer: Self-pay | Admitting: *Deleted

## 2019-07-06 ENCOUNTER — Emergency Department (HOSPITAL_COMMUNITY)
Admission: EM | Admit: 2019-07-06 | Discharge: 2019-07-06 | Disposition: A | Discharge status: Home / Self Care | Payer: No Typology Code available for payment source | Attending: Emergency Medicine | Admitting: Emergency Medicine

## 2019-07-06 ENCOUNTER — Emergency Department (HOSPITAL_COMMUNITY): Payer: No Typology Code available for payment source

## 2019-07-06 DIAGNOSIS — Z79899 Other long term (current) drug therapy: Secondary | ICD-10-CM | POA: Insufficient documentation

## 2019-07-06 DIAGNOSIS — M79602 Pain in left arm: Secondary | ICD-10-CM

## 2019-07-06 DIAGNOSIS — R0789 Other chest pain: Secondary | ICD-10-CM | POA: Insufficient documentation

## 2019-07-06 DIAGNOSIS — R9431 Abnormal electrocardiogram [ECG] [EKG]: Secondary | ICD-10-CM | POA: Diagnosis present

## 2019-07-06 DIAGNOSIS — G8929 Other chronic pain: Secondary | ICD-10-CM

## 2019-07-06 LAB — BASIC METABOLIC PANEL
Anion gap: 12 (ref 5–15)
BUN: 12 mg/dL (ref 6–20)
CO2: 23 mmol/L (ref 22–32)
Calcium: 9.6 mg/dL (ref 8.9–10.3)
Chloride: 104 mmol/L (ref 98–111)
Creatinine, Ser: 0.95 mg/dL (ref 0.44–1.00)
GFR calc Af Amer: >60 mL/min (ref >60)
GFR calc non Af Amer: >60 mL/min (ref >60)
Glucose, Bld: 125 mg/dL — ABNORMAL HIGH (ref 70–99)
Potassium: 4.2 mmol/L (ref 3.5–5.1)
Sodium: 139 mmol/L (ref 135–145)

## 2019-07-06 LAB — CBC
HCT: 43.1 % (ref 36.0–46.0)
Hemoglobin: 13.7 g/dL (ref 12.0–15.0)
MCH: 32.4 pg (ref 26.0–34.0)
MCHC: 31.8 g/dL (ref 30.0–36.0)
MCV: 101.9 fL — ABNORMAL HIGH (ref 80.0–100.0)
Platelets: 367 10*3/uL (ref 150–400)
RBC: 4.23 MIL/uL (ref 3.87–5.11)
RDW: 13.4 % (ref 11.5–15.5)
WBC: 8.5 10*3/uL (ref 4.0–10.5)
nRBC: 0 % (ref 0.0–0.2)

## 2019-07-06 LAB — TROPONIN I (HIGH SENSITIVITY)
Troponin I (High Sensitivity): <2 ng/L (ref <18)
Troponin I (High Sensitivity): <2 ng/L (ref <18)

## 2019-07-06 LAB — I-STAT BETA HCG BLOOD, ED (MC, WL, AP ONLY): I-stat hCG, quantitative: <5 m[IU]/mL (ref <5)

## 2019-07-06 MED ORDER — SODIUM CHLORIDE 0.9% FLUSH: 3.0000 mL | Freq: Once | INTRAVENOUS | Status: DC

## 2019-07-06 NOTE — Discharge Instructions (Signed)
Please read instructions below. Follow up with your cardiologist.  Return to the ER for new or worsening symptoms; including worsening chest pain, shortness of breath, pain that radiates to the arm or neck, pain or shortness of breath worsened with exertion.

## 2019-07-06 NOTE — ED Provider Notes (Signed)
[facility] EMERGENCY DEPARTMENT Provider Note   CSN: [id number] Arrival date & time: 07/06/19  1502     History Chief Complaint  Patient presents with  . Abnormal ECG  . Arm Pain    Janet Walsh is a 54 y.o. female with history of chronic chest pain, GERD, anxiety, presenting to the emergency department with complaint of left-sided arm pain that began 2 days ago.  She states she has chronic left-sided dull chest pain that comes and goes.  She states it is usually due to her GERD or anxiety.  She also has some intermittent tingling in her left hand.  She states 2 days ago however she feels as though the pain and tingling in her left hand traveled up her arm into her shoulder which is new for her.  Her chest pain has not changed in any way.  She denies associated shortness of breath, nausea, diaphoresis, or new neck pain.  She went to a [facility] facility who recommended she come to the ED for abnormal EKG. She has had normal stress tests and what sounds to be extensive work-up outpatient for her chronic chest pain by cardiology.  She is followed by Dr. Katrinka Blazing, and has an appointment on Wednesday of next week. The history is provided by the patient.       Past Medical History:  Diagnosis Date  . GERD (gastroesophageal reflux disease)   . Migraines   . Panic attacks   . Vertigo     Patient Active Problem List   Diagnosis Date Noted  . Palpitations 05/11/2014  . Chest tightness 05/11/2014    Past Surgical History:  Procedure Laterality Date  . CESAREAN SECTION  1998  . DILATION AND CURETTAGE OF UTERUS    . UTERINE FIBROID SURGERY       OB History   No obstetric history on file.     Family History  Problem Relation Age of Onset  . Hypertension Mother   . Hyperlipidemia Mother   . High blood pressure Sister   . Heart disease Maternal Uncle     Social History   Tobacco Use  . Smoking status: Never Smoker  . Smokeless tobacco: Never Used    Substance Use Topics  . Alcohol use: Yes    Comment: occasionally  . Drug use: No    Home Medications Prior to Admission medications   Medication Sig Start Date End Date Taking? Authorizing Provider  esomeprazole (NEXIUM) 40 MG capsule Take 40 mg by mouth daily as needed (for reflux).    Yes [provider]  naproxen (NAPROSYN) 500 MG tablet Take 500 mg by mouth 2 (two) times daily as needed (for severe pain).   Yes [provider]  NON FORMULARY Take 1 tablet by mouth See admin instructions. VitafusionT MultiVites Gummy Vitamins- Chew 1 gummie by mouth once a day   Yes [provider]  sertraline (ZOLOFT) 50 MG tablet Take 50 mg by mouth at bedtime. 05/02/19  Yes [provider]  valACYclovir (VALTREX) 500 MG tablet Take 500 mg by mouth at bedtime.   Yes [provider]  pantoprazole (PROTONIX) 20 MG tablet Take 1 tablet (20 mg total) by mouth daily. Patient not taking: Reported on 07/06/2019 03/16/17   Arby Barrette, MD  sertraline (ZOLOFT) 100 MG tablet Take 100 mg by mouth at bedtime. 07/06/19   [provider]  simethicone (GAS-X) 80 MG chewable tablet 1 every 6 hours as needed for bloating or gas.  Patient not taking: Reported on 07/06/2019 03/16/17   Arby Barrette, MD    Allergies    Tape  Review of Systems   Review of Systems  All other systems reviewed and are negative.   Physical Exam Updated Vital Signs BP 125/68   Pulse 60   Temp 98.6 F (37 C) (Oral)   Resp (!) 22   SpO2 99%   Physical Exam Vitals and nursing note reviewed.  Constitutional:      General: She is not in acute distress.    Appearance: She is well-developed.  HENT:     Head: Normocephalic and atraumatic.  Eyes:     Conjunctiva/sclera: Conjunctivae normal.  Cardiovascular:     Rate and Rhythm: Normal rate and regular rhythm.     Pulses: Normal pulses.     Heart sounds: Normal heart sounds.  Pulmonary:     Effort: Pulmonary effort is normal.  No respiratory distress.     Breath sounds: Normal breath sounds.  Abdominal:     General: Bowel sounds are normal.     Palpations: Abdomen is soft.     Tenderness: There is no abdominal tenderness.  Musculoskeletal:     Right lower leg: No edema.     Left lower leg: No edema.  Skin:    General: Skin is warm.  Neurological:     Mental Status: She is alert.  Psychiatric:        Behavior: Behavior normal.     ED Results / Procedures / Treatments   Labs (all labs ordered are listed, but only abnormal results are displayed) Labs Reviewed  BASIC METABOLIC PANEL - Abnormal; Notable for the following components:      Result Value   Glucose, Bld 125 (*)    All other components within normal limits  CBC - Abnormal; Notable for the following components:   MCV 101.9 (*)    All other components within normal limits  I-STAT BETA HCG BLOOD, ED ([facility], [facility], [facility] ONLY)  TROPONIN I (HIGH SENSITIVITY)  TROPONIN I (HIGH SENSITIVITY)    EKG EKG Interpretation  Date/Time:  Thursday July 06 2019 15:01:58 EDT Ventricular Rate:  101 PR Interval:  138 QRS Duration: 74 QT Interval:  330 QTC Calculation: 427 R Axis:   39 Text Interpretation: Sinus tachycardia Nonspecific ST and T wave abnormality Abnormal ECG When compared to prior, no significant cahgnes seen. No STEMI Confirmed by Theda Belfast ([id number]) on 07/06/2019 3:44:12 PM   Radiology DG Chest 2 View  Result Date: 07/06/2019 CLINICAL DATA:  Left-sided chest pain. EXAM: CHEST - 2 VIEW COMPARISON:  March 16, 2017 FINDINGS: The heart size and mediastinal contours are within normal limits. Both lungs are clear. The visualized skeletal structures are unremarkable. IMPRESSION: No active cardiopulmonary disease. Electronically Signed   By: Aram Candela M.D.   On: 07/06/2019 15:35    Procedures Procedures (including critical care time)  Medications Ordered in ED Medications  sodium chloride flush (NS) 0.9 % injection 3 mL (3 mLs  Intravenous Not Given 07/06/19 1529)    ED Course  I have reviewed the triage vital signs and the nursing notes.  Pertinent labs & imaging results that were available during my care of the patient were reviewed by me and considered in my medical decision making (see chart for details).    MDM Rules/Calculators/A&P                         Patient with  chronic left-sided chest pain and left hand tingling, presenting with some increased pain to her left arm for the last 2 days.  She was seen by PCP today who noted abnormal EKG and sent her to the ED for evaluation.  She had nonischemic exercise tolerance test in March 2019.  She states her chest pain is usually due to GERD or anxiety.  On exam she is well-appearing in no distress.  Heart and lung sounds are normal.  EKG does have some ST and T wave abnormalities however this is unchanged when compared to her prior EKGs.  Lab work is very reassuring.  Trop x 2 are neg.  Low suspicion for ACS.  Recommend outpatient follow-up, she states she has cardiology appointment on Wednesday.  Return precautions discussed.  Patient is well-appearing, agreeable to plan, and safe for discharge.  Discussed results, findings, treatment and follow up. Patient advised of return precautions. Patient verbalized understanding and agreed with plan.  Final Clinical Impression(s) / ED Diagnoses Final diagnoses:  Chronic chest pain  Left arm pain    Rx / DC Orders ED Discharge Orders    None       Suezette Lafave, [place] N, PA-C 07/06/19 1915    Sherwood Gambler, MD 07/11/19 1032

## 2019-07-06 NOTE — ED Notes (Signed)
Patient verbalizes understanding of discharge instructions. Opportunity for questioning and answers were provided. Armband removed by staff, pt discharged from ED stable & ambulatory  

## 2019-07-06 NOTE — ED Triage Notes (Signed)
Pt reports chronic chest pain. Began having left arm and shoulder pain yesterday and went to pcp, sent here due to abnormal ekg. Also has pain to her left side of neck and bilateral legs.

## 2019-07-11 NOTE — Progress Notes (Signed)
Cardiology Office Note:    Date:  07/12/2019   ID:  Janet Walsh, DOB Dec 05, 1965, MRN [id number]  PCP:  Wilburn Mylar, MD  Cardiologist:  Lesleigh Noe, MD   Referring MD: Wilburn Mylar, MD   Chief Complaint  Patient presents with  . Chest Pain    History of Present Illness:    Janet Walsh is a 54 y.o. female with a hx of GERD, migraines, panic attacks, chest pain, prior negative cardiac w/u 2014 and 2019. Recent emergency room visit for left arm pain.  Has anxiety concerning work and family health.  She has done significant traveling over the last month going to [place] and then also driving to [place] after returning.  Following that she has had neck discomfort, arm tingling, mid back discomfort with pain radiating around the rib cage, and left leg discomfort.  She was seen in the emergency room because of the left arm pain.  An EKG was done that she had diffuse J-point depression but no active chest pain and no marked evidence of cardiac injury.  She has a tendency towards anxiety.  States that she was very anxious when she went to the emergency room because she was worried that she was having an acute problem.  She has been seen intermittently by cardiology because of the recurring sob costal and left sided chest discomfort.  Ischemic evaluation has been negative on 2 prior occasions including a recent exercise treadmill test in 2019.  Past Medical History:  Diagnosis Date  . GERD (gastroesophageal reflux disease)   . Migraines   . Panic attacks   . Vertigo     Past Surgical History:  Procedure Laterality Date  . CESAREAN SECTION  1998  . DILATION AND CURETTAGE OF UTERUS    . UTERINE FIBROID SURGERY      Current Medications: Current Meds  Medication Sig  . esomeprazole (NEXIUM) 40 MG capsule Take 40 mg by mouth daily as needed (for reflux).   . naproxen (NAPROSYN) 500 MG tablet Take 500 mg by mouth 2 (two) times daily as needed (for severe  pain).  . NON FORMULARY Take 1 tablet by mouth See admin instructions. VitafusionT MultiVites Gummy Vitamins- Chew 1 gummie by mouth once a day  . pantoprazole (PROTONIX) 20 MG tablet Take 1 tablet (20 mg total) by mouth daily.  . sertraline (ZOLOFT) 100 MG tablet Take 100 mg by mouth at bedtime.  . simethicone (GAS-X) 80 MG chewable tablet 1 every 6 hours as needed for bloating or gas.  . valACYclovir (VALTREX) 500 MG tablet Take 500 mg by mouth at bedtime.     Allergies:   Tape   Social History   Socioeconomic History  . Marital status: Single    Spouse name: Not on file  . Number of children: Not on file  . Years of education: Not on file  . Highest education level: Not on file  Occupational History  . Not on file  Tobacco Use  . Smoking status: Never Smoker  . Smokeless tobacco: Never Used  Substance and Sexual Activity  . Alcohol use: Yes    Comment: occasionally  . Drug use: No  . Sexual activity: Not on file  Other Topics Concern  . Not on file  Social History Narrative  . Not on file   Social Determinants of Health   Financial Resource Strain:   . Difficulty of Paying Living Expenses:   Food Insecurity:   .  Worried About Programme researcher, broadcasting/film/video in the Last Year:   . Barista in the Last Year:   Transportation Needs:   . Freight forwarder (Medical):   Marland Kitchen Lack of Transportation (Non-Medical):   Physical Activity:   . Days of Exercise per Week:   . Minutes of Exercise per Session:   Stress:   . Feeling of Stress :   Social Connections:   . Frequency of Communication with Friends and Family:   . Frequency of Social Gatherings with Friends and Family:   . Attends Religious Services:   . Active Member of Clubs or Organizations:   . Attends Banker Meetings:   Marland Kitchen Marital Status:      Family History: The patient's family history includes Heart disease in her maternal uncle; High blood pressure in her sister; Hyperlipidemia in her mother;  Hypertension in her mother.  ROS:   Please see the history of present illness.    Anxiety, stress, and occasional difficulty sleeping.  Her job requires prolonged sitting.  All other systems reviewed and are negative.  EKGs/Labs/Other Studies Reviewed:    The following studies were reviewed today:  ETT 03/2017: Study Highlights   The patient walked for a total of 5 minutes and 37 seconds on a Bruce protocol treadmill test. The peak heart rate was 179 which is 107% predicted maximal heart rate.  There were no ST or T wave changes to suggest ischemia. The blood pressure response to exercise was normal. There were no significant arrhythmias.  This is interpreted as a negative stress test. There is no evidence of ischemia.     EKG:  EKG  On 07/06/19 ECG performed by ER shows NSR with diffuse J point depression.  Today's EKG demonstrates normal sinus rhythm, heart rate 66 bpm, with normal EKG appearance.  Recent Labs: 07/06/2019: BUN 12; Creatinine, Ser 0.95; Hemoglobin 13.7; Platelets 367; Potassium 4.2; Sodium 139  Recent Lipid Panel No results found for: CHOL, TRIG, HDL, CHOLHDL, VLDL, LDLCALC, LDLDIRECT  Physical Exam:    VS:  BP 120/80   Pulse 66   Ht 5\' 4"  (1.626 m)   Wt 177 lb 9.6 oz (80.6 kg)   SpO2 97%   BMI 30.48 kg/m     Wt Readings from Last 3 Encounters:  07/12/19 177 lb 9.6 oz (80.6 kg)  03/17/17 165 lb 6.4 oz (75 kg)  03/16/17 166 lb (75.3 kg)     GEN: Mildly obese. No acute distress HEENT: Normal NECK: No JVD. LYMPHATICS: No lymphadenopathy CARDIAC:  RRR without murmur, gallop, or edema. VASCULAR:  Normal Pulses. No bruits. RESPIRATORY:  Clear to auscultation without rales, wheezing or rhonchi  ABDOMEN: Soft, non-tender, non-distended, No pulsatile mass, MUSCULOSKELETAL: No deformity  SKIN: Warm and dry NEUROLOGIC:  Alert and oriented x 3 PSYCHIATRIC:  Normal affect   ASSESSMENT:    1. Left arm pain   2. Chest pain of uncertain etiology   3.  Palpitations   4. Gastroesophageal reflux disease, unspecified whether esophagitis present   5. Educated about COVID-19 virus infection    PLAN:    In order of problems listed above:  1. More likely related to cervical disc disease. 2. 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 3. No current complaint of palpitations. 4. Uses oral antacids intermittently. 5. She has been vaccinated. 6. Cardiac risk score is low as she is not hypertensive, LDL cholesterol is 80 on no therapy, she does  not smoke, and there is no family history of vascular disease.  Overall education and awareness concerning primary risk prevention was discussed in detail: LDL less than 70, hemoglobin A1c less than 7, blood pressure target less than 130/80 mmHg, >150 minutes of moderate aerobic activity per week, avoidance of smoking, weight control (via diet and exercise), and continued surveillance/management of/for obstructive sleep apnea.    Medication Adjustments/Labs and Tests Ordered: Current medicines are reviewed at length with the patient today.  Concerns regarding medicines are outlined above.  Orders Placed This Encounter  Procedures  . EKG 12-Lead   No orders of the defined types were placed in this encounter.   Patient Instructions  Medication Instructions:  Your physician recommends that you continue on your current medications as directed. Please refer to the Current Medication list given to you today.  *If you need a refill on your cardiac medications before your next appointment, please call your pharmacy*   Lab Work: None If you have labs (blood work) drawn today and your tests are completely normal, you will receive your results only by: Marland Kitchen MyChart Message (if you have MyChart) OR . A paper copy in the mail If you have any lab test that is abnormal or we need to change your treatment, we will call you to review the  results.   Testing/Procedures: None   Follow-Up: At [facility], you and your health needs are our priority.  As part of our continuing mission to provide you with exceptional heart care, we have created designated Provider Care Teams.  These Care Teams include your primary Cardiologist (physician) and Advanced Practice Providers (APPs -  Physician Assistants and Nurse Practitioners) who all work together to provide you with the care you need, when you need it.  We recommend signing up for the patient portal called "MyChart".  Sign up information is provided on this After Visit Summary.  MyChart is used to connect with patients for Virtual Visits (Telemedicine).  Patients are able to view lab/test results, encounter notes, upcoming appointments, etc.  Non-urgent messages can be sent to your provider as well.   To learn more about what you can do with MyChart, go to [email or web address].    Your next appointment:   2 years  The format for your next appointment:   In Person  Provider:   You may see Lesleigh Noe, MD or one of the following Advanced Practice Providers on your designated Care Team:    Norma Fredrickson, NP  Nada Boozer, NP  Georgie Chard, NP    Other Instructions       Signed, Lesleigh Noe, MD  07/12/2019 8:43 AM     [facility]

## 2019-07-12 ENCOUNTER — Encounter: Payer: Self-pay | Admitting: Interventional Cardiology

## 2019-07-12 ENCOUNTER — Other Ambulatory Visit: Payer: Self-pay

## 2019-07-12 ENCOUNTER — Ambulatory Visit (INDEPENDENT_AMBULATORY_CARE_PROVIDER_SITE_OTHER): Payer: No Typology Code available for payment source | Admitting: Interventional Cardiology

## 2019-07-12 VITALS — BP 120/80 | HR 66 | Ht 64.0 in | Wt 177.6 lb

## 2019-07-12 DIAGNOSIS — K219 Gastro-esophageal reflux disease without esophagitis: Secondary | ICD-10-CM

## 2019-07-12 DIAGNOSIS — Z7189 Other specified counseling: Secondary | ICD-10-CM

## 2019-07-12 DIAGNOSIS — R079 Chest pain, unspecified: Secondary | ICD-10-CM

## 2019-07-12 DIAGNOSIS — M79602 Pain in left arm: Secondary | ICD-10-CM | POA: Diagnosis not present

## 2019-07-12 DIAGNOSIS — R002 Palpitations: Secondary | ICD-10-CM

## 2019-07-12 NOTE — Patient Instructions (Signed)
Medication Instructions:  Your physician recommends that you continue on your current medications as directed. Please refer to the Current Medication list given to you today.  *If you need a refill on your cardiac medications before your next appointment, please call your pharmacy*   Lab Work: None If you have labs (blood work) drawn today and your tests are completely normal, you will receive your results only by: . MyChart Message (if you have MyChart) OR . A paper copy in the mail If you have any lab test that is abnormal or we need to change your treatment, we will call you to review the results.   Testing/Procedures: None   Follow-Up: At [facility], you and your health needs are our priority.  As part of our continuing mission to provide you with exceptional heart care, we have created designated Provider Care Teams.  These Care Teams include your primary Cardiologist (physician) and Advanced Practice Providers (APPs -  Physician Assistants and Nurse Practitioners) who all work together to provide you with the care you need, when you need it.  We recommend signing up for the patient portal called "MyChart".  Sign up information is provided on this After Visit Summary.  MyChart is used to connect with patients for Virtual Visits (Telemedicine).  Patients are able to view lab/test results, encounter notes, upcoming appointments, etc.  Non-urgent messages can be sent to your provider as well.   To learn more about what you can do with MyChart, go to [email or web address].    Your next appointment:   2 year(s)  The format for your next appointment:   In Person  Provider:   You may see Henry W Smith III, MD or one of the following Advanced Practice Providers on your designated Care Team:    Lori Gerhardt, NP  Laura Ingold, NP  Jill McDaniel, NP    Other Instructions   

## 2019-08-04 IMAGING — CR DG CHEST 2V
2 series · 2 of 2 positions shown · non-contrast
Comparison: Chest radiograph February 01, 2016

CLINICAL DATA: Central chest pain radiating to back.

EXAM:
CHEST - 2 VIEW

[w chest pa]
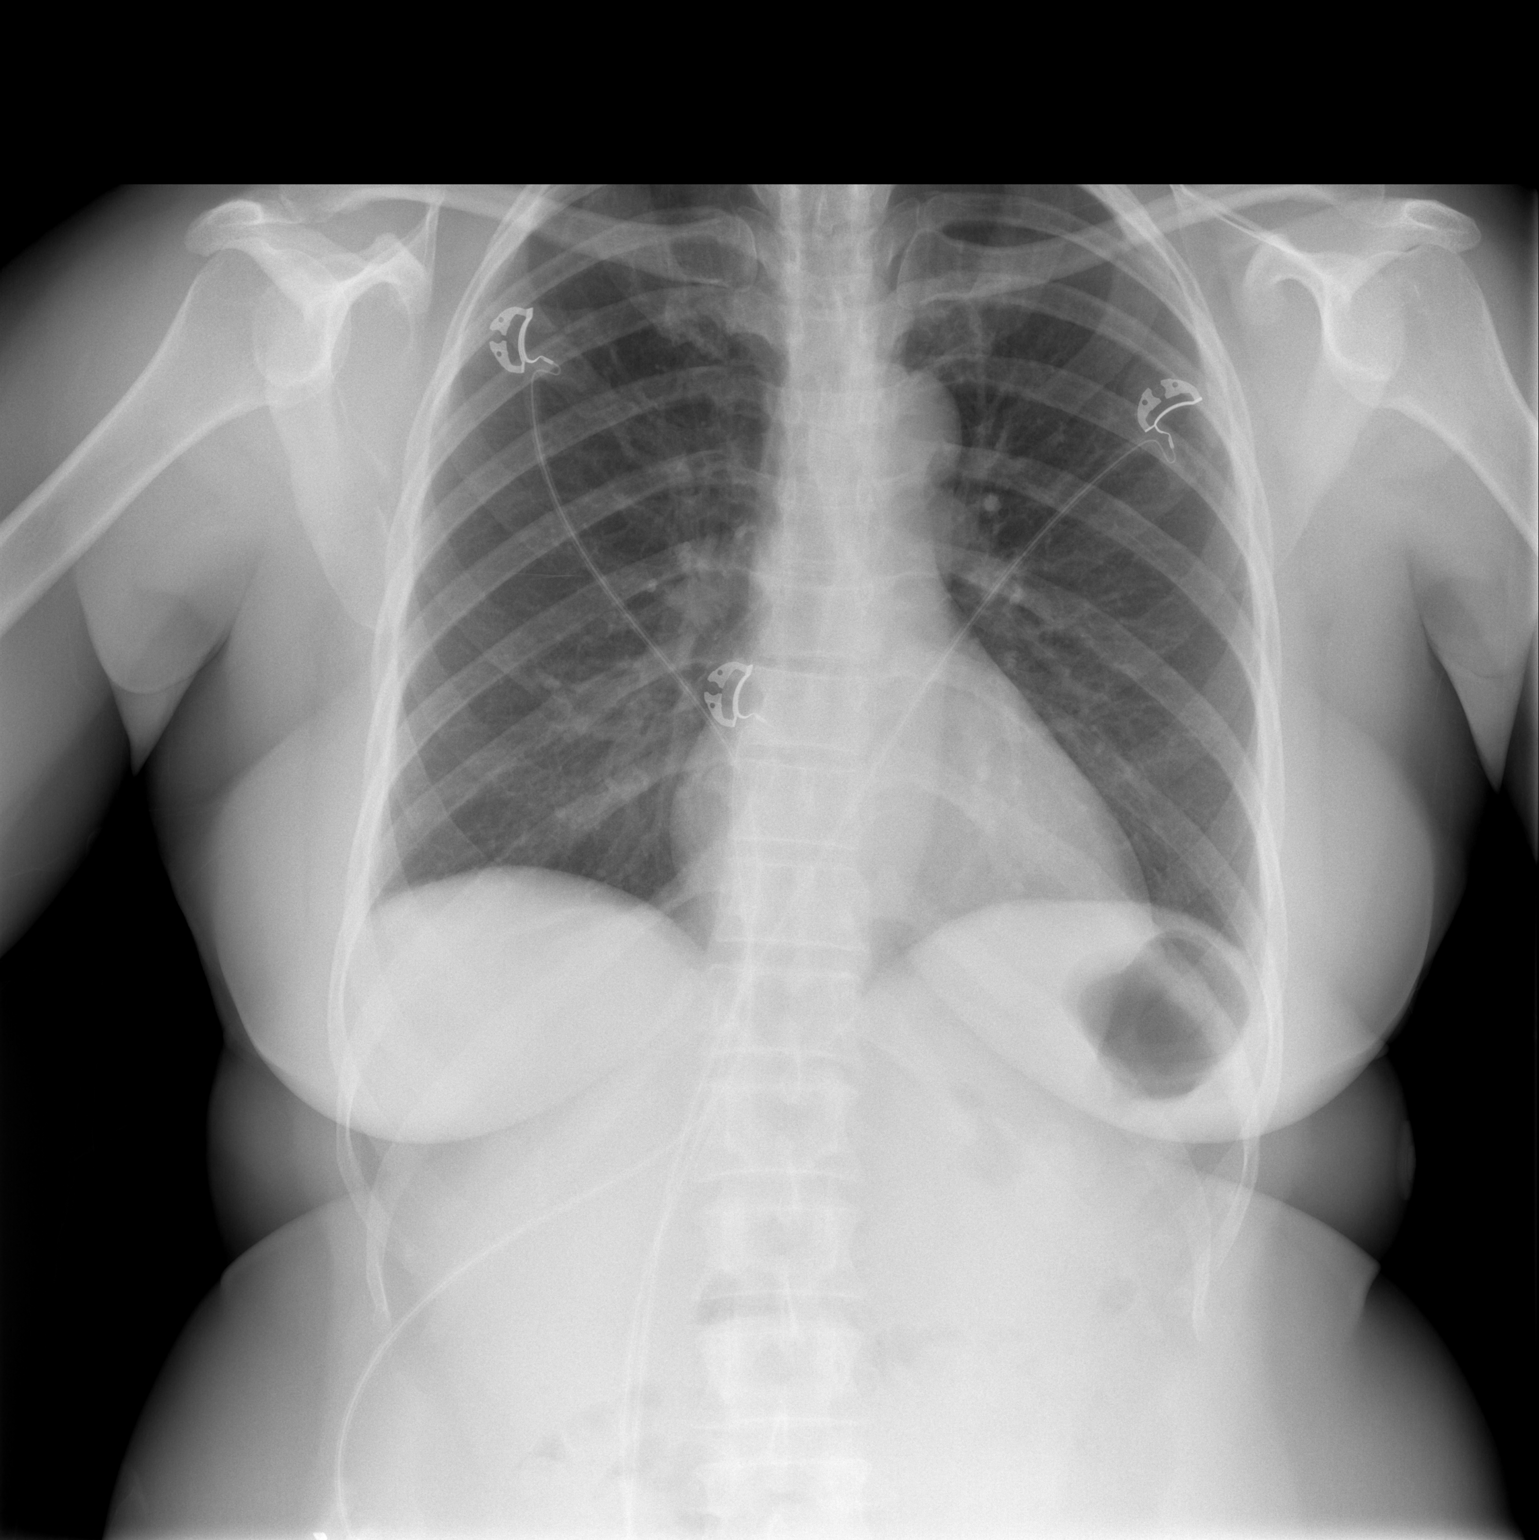

[w chest lat]
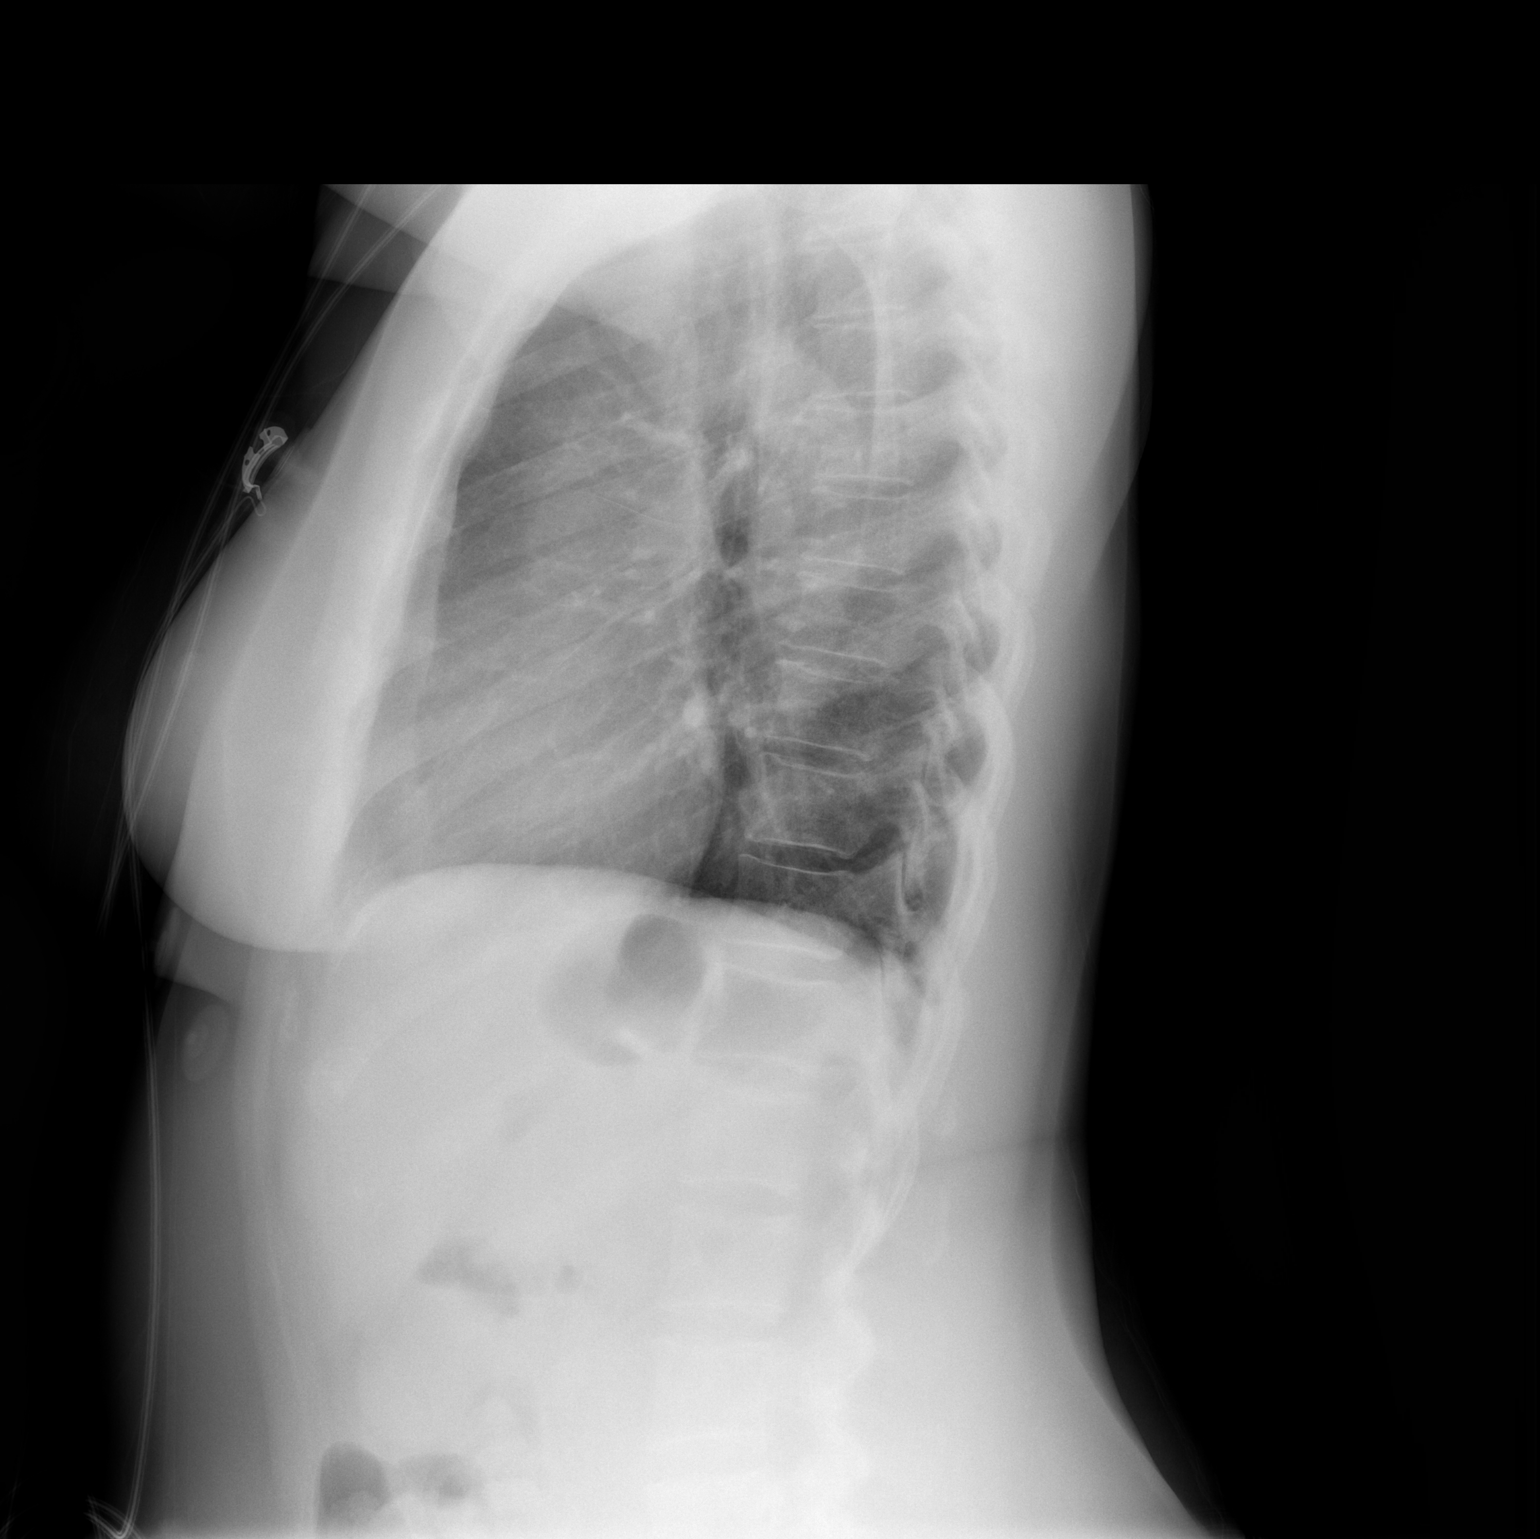

[2 of 2 positions shown; findings below may reference images not displayed]

FINDINGS: Cardiomediastinal silhouette is normal. No pleural effusions or
focal consolidations. Trachea projects midline and there is no
pneumothorax. Soft tissue planes and included osseous structures are
non-suspicious.
IMPRESSION: Negative.

## 2019-10-26 ENCOUNTER — Telehealth: Payer: Self-pay | Admitting: Interventional Cardiology

## 2019-10-26 NOTE — Telephone Encounter (Signed)
Pt c/o of Chest Pain: STAT if CP now or developed within 24 hours  1. Are you having CP right now? no  2. Are you experiencing any other symptoms (ex. SOB, nausea, vomiting, sweating)? Tingling in left arm, slight headache  3. How long have you been experiencing CP? 3:00am  4. Is your CP continuous or coming and going? Came and went  5. Have you taken Nitroglycerin? no ?  Patient states at 3:00 am this morning she had a short pain under breast. She states she has been awake since then with anxiety about it. She states it went away, but she also started having a tingling in her left arm and palm. She states she also got a slight headache, but is not having any other symptoms.

## 2019-10-26 NOTE — Telephone Encounter (Signed)
Spoke with pt who states she had a sharp pain under her breast about 3am that quickly went away but then her left arm began tingling.  Pt states she became anxious trying to decide if she was sweating or having pain radiating somewhere else.  Pt reports she has had a slight headache for the last few days but has a history of migraines and GERD for which she does take medication. Pt states she ate spaghetti last night and sometimes when she has pressure in her chest it is because she needs to "burp" and she can lay on her left side to make that happen. She also acknowledges she has a problem with anxiety.  Pt last saw Dr Katrinka Blazing 07/12/2019 with normal findings.  Pt has had a negative cardiac work up in 2014 and 2019 per Dr Michaelle Copas note.  Pt reassured that pain she had last night does not sound cardiac in nature but if symptoms return to please let Dr Katrinka Blazing know and we can arrange follow up sooner than 06/23.  Pt should consider follow up with her PCP as well for continued management of GERD, migraines and anxiety.  Reviewed ED protocol with pt who verbalizes understanding and agrees with current plan.

## 2019-11-12 ENCOUNTER — Ambulatory Visit (HOSPITAL_COMMUNITY)
Admission: EM | Admit: 2019-11-12 | Discharge: 2019-11-12 | Disposition: A | Discharge status: Home / Self Care | Payer: No Typology Code available for payment source

## 2019-11-12 ENCOUNTER — Emergency Department (HOSPITAL_COMMUNITY): Payer: No Typology Code available for payment source

## 2019-11-12 ENCOUNTER — Emergency Department (HOSPITAL_COMMUNITY)
Admission: EM | Admit: 2019-11-12 | Discharge: 2019-11-12 | Disposition: A | Discharge status: Home / Self Care | Payer: No Typology Code available for payment source | Attending: Emergency Medicine | Admitting: Emergency Medicine

## 2019-11-12 ENCOUNTER — Encounter (HOSPITAL_COMMUNITY): Payer: Self-pay

## 2019-11-12 ENCOUNTER — Encounter (HOSPITAL_COMMUNITY): Payer: Self-pay | Admitting: Emergency Medicine

## 2019-11-12 ENCOUNTER — Other Ambulatory Visit: Payer: Self-pay

## 2019-11-12 DIAGNOSIS — F419 Anxiety disorder, unspecified: Secondary | ICD-10-CM | POA: Diagnosis not present

## 2019-11-12 DIAGNOSIS — R079 Chest pain, unspecified: Secondary | ICD-10-CM

## 2019-11-12 DIAGNOSIS — R0789 Other chest pain: Secondary | ICD-10-CM

## 2019-11-12 DIAGNOSIS — R072 Precordial pain: Secondary | ICD-10-CM | POA: Diagnosis present

## 2019-11-12 LAB — CBC
HCT: 43.2 % (ref 36.0–46.0)
Hemoglobin: 13.8 g/dL (ref 12.0–15.0)
MCH: 32 pg (ref 26.0–34.0)
MCHC: 31.9 g/dL (ref 30.0–36.0)
MCV: 100.2 fL — ABNORMAL HIGH (ref 80.0–100.0)
Platelets: 375 10*3/uL (ref 150–400)
RBC: 4.31 MIL/uL (ref 3.87–5.11)
RDW: 13.2 % (ref 11.5–15.5)
WBC: 8.7 10*3/uL (ref 4.0–10.5)
nRBC: 0 % (ref 0.0–0.2)

## 2019-11-12 LAB — BASIC METABOLIC PANEL
Anion gap: 11 (ref 5–15)
BUN: 11 mg/dL (ref 6–20)
CO2: 27 mmol/L (ref 22–32)
Calcium: 9.6 mg/dL (ref 8.9–10.3)
Chloride: 100 mmol/L (ref 98–111)
Creatinine, Ser: 0.81 mg/dL (ref 0.44–1.00)
GFR, Estimated: >60 mL/min (ref >60)
Glucose, Bld: 101 mg/dL — ABNORMAL HIGH (ref 70–99)
Potassium: 3.7 mmol/L (ref 3.5–5.1)
Sodium: 138 mmol/L (ref 135–145)

## 2019-11-12 LAB — TROPONIN I (HIGH SENSITIVITY): Troponin I (High Sensitivity): 3 ng/L (ref <18)

## 2019-11-12 MED ORDER — FAMOTIDINE 20 MG PO TABS
20.0000 mg | ORAL_TABLET | Freq: Once | ORAL | Status: AC
  Administered 2019-11-12: 20 mg via ORAL
  Filled 2019-11-12: qty 1

## 2019-11-12 NOTE — ED Notes (Signed)
Patient is being discharged from the Urgent Care and sent to the Emergency Department via private vehicle . Per Jerrilyn Cairo, NP, patient is in need of higher level of care due to complaint/chest pain. Patient is aware and verbalizes understanding of plan of care.  Vitals:   11/12/19 1553  BP: 138/84  Pulse: 78  Resp: 18  Temp: 98.7 F (37.1 C)  SpO2: 98%

## 2019-11-12 NOTE — ED Triage Notes (Addendum)
Pt c/o intermittent central chest pain, jaw pain and arm tingling x 2 days. Pt chest pain free at this time.

## 2019-11-12 NOTE — ED Provider Notes (Signed)
  [facility]    CSN: [id number] Arrival date & time: 11/12/19  1524      History   Chief Complaint Chief Complaint  Patient presents with  . Chest Pain    HPI Janet Walsh is a 54 y.o. female.   Patient seen in triage evaluated for intermittent chest pain ongoing since Friday.  Patient is followed by Dr. Verdis Prime cardiology and was advised by his office on 1014 if chest pain persisted to follow-up at the emergency department.  She was under the impression that chest pain may been related to acid reflux and has been taken acid reflux medicine medicine over the last few days with continued intermittent central chest pain.  Patient advised to go to the emergency department for further work-up and evaluation.  EKG today shows NSR with nonspecific T wave changes.  Given change in history of recurrent intermittent chest pain advised patient she would need cardiac enzymes to rule out ACS.  Patient verbalized understanding .  She is stable for self transport.     Bing Neighbors, FNP 11/12/19 [phone]

## 2019-11-12 NOTE — Discharge Instructions (Signed)
You likely have reflux.  Continue taking Nexium daily.  You can take Pepcid 20 mg twice daily as needed.  Avoid eating spicy food  See Dr. Katrinka Blazing for follow-up.  Return to ER if you have worse chest pain, trouble breathing, shortness of breath.

## 2019-11-12 NOTE — ED Provider Notes (Signed)
[facility] EMERGENCY DEPARTMENT Provider Note   CSN: [id number] Arrival date & time: 11/12/19  1617     History Chief Complaint  Patient presents with  . Chest Pain    Janet Walsh is a 54 y.o. female history of reflux, panic attacks here presenting with chest pain.  Patient states that she ate some Ramen noodles on Friday night.  Since then she has been having intermittent chest pain.  States that it is substernal and comes and goes.  She has a history of reflux and has not been taking her Nexium as prescribed.  She recently saw Dr. Katrinka Blazing from cardiology.  She had a unremarkable stress test several months ago.  She has no known CAD.  She has history of anxiety and she just wants to be checked out.  Has cardiology follow-up outpatient.  The history is provided by the patient.       Past Medical History:  Diagnosis Date  . GERD (gastroesophageal reflux disease)   . Migraines   . Panic attacks   . Vertigo     Patient Active Problem List   Diagnosis Date Noted  . Palpitations 05/11/2014  . Chest tightness 05/11/2014    Past Surgical History:  Procedure Laterality Date  . CESAREAN SECTION  1998  . DILATION AND CURETTAGE OF UTERUS    . UTERINE FIBROID SURGERY       OB History   No obstetric history on file.     Family History  Problem Relation Age of Onset  . Hypertension Mother   . Hyperlipidemia Mother   . High blood pressure Sister   . Heart disease Maternal Uncle     Social History   Tobacco Use  . Smoking status: Never Smoker  . Smokeless tobacco: Never Used  Substance Use Topics  . Alcohol use: Yes    Comment: occasionally  . Drug use: No    Home Medications Prior to Admission medications   Medication Sig Start Date End Date Taking? Authorizing Provider  esomeprazole (NEXIUM) 40 MG capsule Take 40 mg by mouth daily as needed (for reflux).     [provider]  naproxen (NAPROSYN) 500 MG tablet Take 500 mg by  mouth 2 (two) times daily as needed (for severe pain).    [provider]  NON FORMULARY Take 1 tablet by mouth See admin instructions. VitafusionT MultiVites Gummy Vitamins- Chew 1 gummie by mouth once a day    [provider]  pantoprazole (PROTONIX) 20 MG tablet Take 1 tablet (20 mg total) by mouth daily. 03/16/17   Arby Barrette, MD  sertraline (ZOLOFT) 100 MG tablet Take 100 mg by mouth at bedtime. 07/06/19   [provider]  simethicone (GAS-X) 80 MG chewable tablet 1 every 6 hours as needed for bloating or gas. 03/16/17   Arby Barrette, MD  valACYclovir (VALTREX) 500 MG tablet Take 500 mg by mouth at bedtime.    [provider]    Allergies    Tape  Review of Systems   Review of Systems  Cardiovascular: Positive for chest pain.  All other systems reviewed and are negative.   Physical Exam Updated Vital Signs BP (!) 142/82   Pulse 69   Temp 98.8 F (37.1 C) (Oral)   Resp 16   Ht 5\' 4"  (1.626 m)   Wt 79.8 kg   SpO2 99%   BMI 30.21 kg/m   Physical Exam Vitals and nursing note reviewed.  Constitutional:  Appearance: She is well-developed.     Comments: Slightly anxious   HENT:     Head: Normocephalic.  Eyes:     Extraocular Movements: Extraocular movements intact.     Pupils: Pupils are equal, round, and reactive to light.  Cardiovascular:     Rate and Rhythm: Normal rate and regular rhythm.     Heart sounds: Normal heart sounds.  Pulmonary:     Effort: Pulmonary effort is normal.     Breath sounds: Normal breath sounds.  Abdominal:     General: Bowel sounds are normal.     Palpations: Abdomen is soft.  Musculoskeletal:        General: Normal range of motion.     Cervical back: Normal range of motion and neck supple.  Skin:    General: Skin is warm.     Capillary Refill: Capillary refill takes less than 2 seconds.  Neurological:     General: No focal deficit present.     Mental Status: She is alert and oriented to  person, place, and time.  Psychiatric:        Mood and Affect: Mood normal.        Behavior: Behavior normal.     ED Results / Procedures / Treatments   Labs (all labs ordered are listed, but only abnormal results are displayed) Labs Reviewed  BASIC METABOLIC PANEL - Abnormal; Notable for the following components:      Result Value   Glucose, Bld 101 (*)    All other components within normal limits  CBC - Abnormal; Notable for the following components:   MCV 100.2 (*)    All other components within normal limits  TROPONIN I (HIGH SENSITIVITY)  TROPONIN I (HIGH SENSITIVITY)    EKG EKG Interpretation  Date/Time:  Sunday November 12 2019 16:22:55 EDT Ventricular Rate:  96 PR Interval:  146 QRS Duration: 76 QT Interval:  348 QTC Calculation: 439 R Axis:   38 Text Interpretation: Normal sinus rhythm Nonspecific ST and T wave abnormality Abnormal ECG No significant change since last tracing Confirmed by Richardean Canal [phone]) on 11/12/2019 5:54:01 PM   Radiology DG Chest 2 View  Result Date: 11/12/2019 CLINICAL DATA:  Chest pain. EXAM: CHEST - 2 VIEW COMPARISON:  July 06, 2019 FINDINGS: The heart size and mediastinal contours are within normal limits. Both lungs are clear. The visualized skeletal structures are unremarkable. IMPRESSION: No active cardiopulmonary disease. Electronically Signed   By: Gerome Sam III M.D   On: 11/12/2019 17:00    Procedures Procedures (including critical care time)  Medications Ordered in ED Medications  famotidine (PEPCID) tablet 20 mg (20 mg Oral Given 11/12/19 1805)    ED Course  I have reviewed the triage vital signs and the nursing notes.  Pertinent labs & imaging results that were available during my care of the patient were reviewed by me and considered in my medical decision making (see chart for details).    MDM Rules/Calculators/A&P                         Janet Walsh is a 54 y.o. female who presented with chest  pain.  Worsening pain after she ate Ramen noodles.  Is been gone for about 2 days or so.  Patient appears well and I doubt PE or dissection.  Low risk for ACS and symptoms for 2 days so troponin x1 sufficient.  6:11 PM Labs and troponin and chest  x-ray are unremarkable.  I think likely reflux.  Recommend Pepcid as needed and she needs to take Nexium daily and avoid any spicy food.  Recommend cardiology follow-up outpatient.   Final Clinical Impression(s) / ED Diagnoses Final diagnoses:  None    Rx / DC Orders ED Discharge Orders    None       Charlynne Pander, MD 11/12/19 1811

## 2019-11-12 NOTE — ED Triage Notes (Signed)
Pt present central chest pain, symptom started on Friday.

## 2020-03-25 ENCOUNTER — Telehealth: Payer: Self-pay | Admitting: Interventional Cardiology

## 2020-03-25 NOTE — Telephone Encounter (Signed)
Per patient schedule message:  "Still experiencing chest pains and numbness/tingling/burning sensation in left arm ,neck and eye. Headaches after i feel the chest pain. And elevated hear rate. I have a Fitbit watch. Should not have pain for no reason"    I scheduled the patient for 04/16/20 with Jacolyn Reedy, PA.

## 2020-03-25 NOTE — Telephone Encounter (Signed)
Patient states "she does not think someone should have pains for no reason." The pain comes and go's under left breast, then tingling in left arm and headache after. Pain can be sharp or dull. She is currently seeing a neurologist, she has had an MRI. Further nerve testing 03/27/20 arms. She has seen GI doctor and is taking medication for GERD as needed. Her PCP treats anxiety with Sertraline. Heart rate on fitbit ~70. Has not missed any medications. Pain has not changed in nature, expresses concern that it is still there and she has seen all these different doctors with no answers. Spoke to her about previous testing and ED visit reports with all good results. She appreciates the call back and would just like some answers.  Will forward to Dr. Katrinka Blazing and his nurse for advisement.

## 2020-03-27 NOTE — Telephone Encounter (Signed)
Spoke with pt and made her aware to keep appt.  Pt appreciative for call.

## 2020-03-27 NOTE — Telephone Encounter (Signed)
Keep appointment with Janet Walsh.

## 2020-04-09 NOTE — Progress Notes (Signed)
Cardiology Office Note    Date:  04/16/2020   ID:  Janet Walsh, DOB [birth year], MRN [id number]   PCP:  Macy Mis, MD   [facility]  Cardiologist:  Lesleigh Noe, MD  Advanced Practice Provider:  No care team member to display Electrophysiologist:  None   [id number]}   Chief Complaint  Patient presents with  . Chest Pain    History of Present Illness:  Janet Walsh is a 55 y.o. female with history of chest pain with prior negative cardiac work-up in 2014 in 2019, GERD, migraines, panic attacks.  Patient last saw Dr. Katrinka Blazing 07/12/2019 and felt most likely to have cervical disc disease.  He felt her cardiac risk score was low she was not hypertensive, LDL cholesterol 80 on no therapy, no family history of vascular disease and she does not smoke.  Complains of sharp pain under left breast around to her back. Also gets arm and neck tingling. Left eye twitching. Occurs at rest. Tries to calm herself and it goes away. Occurs 2-3 times a day at random times-sitting down, mowing lawn, talking on the phone. Bought a stationary bike and also walking does about a mile- gets about 3000-4000 steps daily.No chest pain with daily exercise and doesn't seem to be a trigger. Stressful job doing collections. Does have indigestion but no longer on Nexium or Protonix. Has gained 20 lbs with covid19. LDL 84 02/15/19. Her mother lives in [place] and has a murmur. Uncle several stents, another uncle with heart disease. MRI 03/13/20 mod canal stenosis C4-5. Saw neurologist and told to see a spine specialist.   Past Medical History:  Diagnosis Date  . GERD (gastroesophageal reflux disease)   . Migraines   . Panic attacks   . Vertigo     Past Surgical History:  Procedure Laterality Date  . CESAREAN SECTION  1998  . DILATION AND CURETTAGE OF UTERUS    . UTERINE FIBROID SURGERY      Current Medications: Current Meds  Medication Sig  . NON FORMULARY Take 1  tablet by mouth See admin instructions. VitafusionT MultiVites Gummy Vitamins- Chew 1 gummie by mouth once a day  . pantoprazole (PROTONIX) 20 MG tablet Take 1 tablet (20 mg total) by mouth daily.  . sertraline (ZOLOFT) 100 MG tablet Take 100 mg by mouth at bedtime.  . valACYclovir (VALTREX) 500 MG tablet Take 500 mg by mouth at bedtime.     Allergies:   Tape   Social History   Socioeconomic History  . Marital status: Single    Spouse name: Not on file  . Number of children: Not on file  . Years of education: Not on file  . Highest education level: Not on file  Occupational History  . Not on file  Tobacco Use  . Smoking status: Never Smoker  . Smokeless tobacco: Never Used  Substance and Sexual Activity  . Alcohol use: Yes    Comment: occasionally  . Drug use: No  . Sexual activity: Not on file  Other Topics Concern  . Not on file  Social History Narrative  . Not on file   Social Determinants of Health   Financial Resource Strain: Not on file  Food Insecurity: Not on file  Transportation Needs: Not on file  Physical Activity: Not on file  Stress: Not on file  Social Connections: Not on file     Family History:  The patient's family history includes Heart disease  in her maternal uncle; High blood pressure in her sister; Hyperlipidemia in her mother; Hypertension in her mother.   ROS:   Please see the history of present illness.    ROS All other systems reviewed and are negative.   PHYSICAL EXAM:   VS:  BP 126/78   Pulse 72   Ht 5\' 4"  (1.626 m)   Wt 181 lb (82.1 kg)   SpO2 97%   BMI 31.07 kg/m   Physical Exam  GEN: Well nourished, well developed, in no acute distress  Neck: no JVD, carotid bruits, or masses Cardiac:RRR; no murmurs, rubs, or gallops  Respiratory:  clear to auscultation bilaterally, normal work of breathing GI: soft, nontender, nondistended, + BS Ext: without cyanosis, clubbing, or edema, Good distal pulses bilaterally Neuro:  Alert and  Oriented x 3 Psych: euthymic mood, full affect  Wt Readings from Last 3 Encounters:  04/16/20 181 lb (82.1 kg)  11/12/19 176 lb (79.8 kg)  07/12/19 177 lb 9.6 oz (80.6 kg)      Studies/Labs Reviewed:   EKG:  EKG is  ordered today.  The ekg ordered today demonstrates NSR no acute change  Recent Labs: 11/12/2019: BUN 11; Creatinine, Ser 0.81; Hemoglobin 13.8; Platelets 375; Potassium 3.7; Sodium 138   Lipid Panel No results found for: CHOL, TRIG, HDL, CHOLHDL, VLDL, LDLCALC, LDLDIRECT  Additional studies/ records that were reviewed today include:  ETT 03/2017: Study Highlights    The patient walked for a total of 5 minutes and 37 seconds on a Bruce protocol treadmill test. The peak heart rate was 179 which is 107% predicted maximal heart rate.  There were no ST or T wave changes to suggest ischemia. The blood pressure response to exercise was normal. There were no significant arrhythmias.  This is interpreted as a negative stress test. There is no evidence of ischemia.        Risk Assessment/Calculations:         ASSESSMENT:    1. History of chest pain   2. Palpitations   3. Gastroesophageal reflux disease, unspecified whether esophagitis present   4. Cervical disc disease   5. Anxiety      PLAN:  In order of problems listed above:  History of chest pain with negative stress echo in 2014 and negative GXT 2019, continues to have atypical chest pain. Would like to proceed with Coronary calcium score. Check labs including FLP, TSH, CBC, CMET  Palpitations-not a recent problem  GERD-restart protonix  Cervical spine disease with a lot of tingling on the left side-saw neurologist and referred to neurosurgery.  Anxiety-refer to Dr. 2020  Shared Decision Making/Informed Consent        Medication Adjustments/Labs and Tests Ordered: Current medicines are reviewed at length with the patient today.  Concerns regarding medicines are outlined above.   Medication changes, Labs and Tests ordered today are listed in the Patient Instructions below. Patient Instructions  Medication Instructions:  Your physician has recommended you make the following change in your medication:   START: Protonix 20mg  daily  *If you need a refill on your cardiac medications before your next appointment, please call your pharmacy*   Lab Work: TODAY: CMET, CBC, TSH, FLP  If you have labs (blood work) drawn today and your tests are completely normal, you will receive your results only by: Dellia Cloud MyChart Message (if you have MyChart) OR . A paper copy in the mail If you have any lab test that is abnormal or we need to  change your treatment, we will call you to review the results.   Testing/Procedures: Your physician has requested you to have a Calcium Score.  Follow-Up: At [facility], you and your health needs are our priority.  As part of our continuing mission to provide you with exceptional heart care, we have created designated Provider Care Teams.  These Care Teams include your primary Cardiologist (physician) and Advanced Practice Providers (APPs -  Physician Assistants and Nurse Practitioners) who all work together to provide you with the care you need, when you need it.  Your next appointment:   1 year(s)  The format for your next appointment:   In Person  Provider:   You may see Lesleigh Noe, MD or one of the following Advanced Practice Providers on your designated Care Team:    Georgie Chard, NP    Other Instructions You have been referred to Dr Dellia Cloud. His office will contact you to schedule an appointment.      Elson Clan, PA-C  04/16/2020 9:26 AM    [facility] [address] [place], [place], [place]  [address] Phone: [phone]; Fax: [phone]

## 2020-04-16 ENCOUNTER — Ambulatory Visit (INDEPENDENT_AMBULATORY_CARE_PROVIDER_SITE_OTHER): Payer: BC Managed Care – PPO | Admitting: Physician Assistant

## 2020-04-16 ENCOUNTER — Encounter: Payer: Self-pay | Admitting: Physician Assistant

## 2020-04-16 ENCOUNTER — Other Ambulatory Visit: Payer: Self-pay

## 2020-04-16 VITALS — BP 126/78 | HR 72 | Ht 64.0 in | Wt 181.0 lb

## 2020-04-16 DIAGNOSIS — R002 Palpitations: Secondary | ICD-10-CM

## 2020-04-16 DIAGNOSIS — Z87898 Personal history of other specified conditions: Secondary | ICD-10-CM | POA: Diagnosis not present

## 2020-04-16 DIAGNOSIS — K219 Gastro-esophageal reflux disease without esophagitis: Secondary | ICD-10-CM

## 2020-04-16 DIAGNOSIS — F419 Anxiety disorder, unspecified: Secondary | ICD-10-CM

## 2020-04-16 DIAGNOSIS — M5090 Cervical disc disorder, unspecified, unspecified cervical region: Secondary | ICD-10-CM | POA: Diagnosis not present

## 2020-04-16 MED ORDER — PANTOPRAZOLE SODIUM 20 MG PO TBEC: 20.0000 mg | DELAYED_RELEASE_TABLET | Freq: Every day | ORAL | 3 refills | Status: DC

## 2020-04-16 NOTE — Patient Instructions (Signed)
Medication Instructions:  Your physician has recommended you make the following change in your medication:   START: Protonix 20mg  daily  *If you need a refill on your cardiac medications before your next appointment, please call your pharmacy*   Lab Work: TODAY: CMET, CBC, TSH, FLP  If you have labs (blood work) drawn today and your tests are completely normal, you will receive your results only by: MyChart Message (if you have MyChart) OR . A paper copy in the mail If you have any lab test that is abnormal or we need to change your treatment, we will call you to review the results.   Testing/Procedures: Your physician has requested you to have a Calcium Score.  Follow-Up: At [facility], you and your health needs are our priority.  As part of our continuing mission to provide you with exceptional heart care, we have created designated Provider Care Teams.  These Care Teams include your primary Cardiologist (physician) and Advanced Practice Providers (APPs -  Physician Assistants and Nurse Practitioners) who all work together to provide you with the care you need, when you need it.  Your next appointment:   1 year(s)  The format for your next appointment:   In Person  Provider:   You may see [facility], MD or one of the following Advanced Practice Providers on your designated Care Team:    Lesleigh Noe, NP    Other Instructions You have been referred to Dr Georgie Chard. His office will contact you to schedule an appointment.

## 2020-04-17 LAB — COMPREHENSIVE METABOLIC PANEL
ALT: 24 IU/L (ref 0–32)
AST: 22 IU/L (ref 0–40)
Albumin/Globulin Ratio: 1.3 (ref 1.2–2.2)
Albumin: 4.5 g/dL (ref 3.8–4.9)
Alkaline Phosphatase: 57 IU/L (ref 44–121)
BUN/Creatinine Ratio: 9 (ref 9–23)
BUN: 8 mg/dL (ref 6–24)
Bilirubin Total: 0.2 mg/dL (ref 0.0–1.2)
CO2: 24 mmol/L (ref 20–29)
Calcium: 10 mg/dL (ref 8.7–10.2)
Chloride: 101 mmol/L (ref 96–106)
Creatinine, Ser: 0.91 mg/dL (ref 0.57–1.00)
Globulin, Total: 3.5 g/dL (ref 1.5–4.5)
Glucose: 121 mg/dL — ABNORMAL HIGH (ref 65–99)
Potassium: 4.6 mmol/L (ref 3.5–5.2)
Sodium: 139 mmol/L (ref 134–144)
Total Protein: 8 g/dL (ref 6.0–8.5)
eGFR: 74 mL/min/{1.73_m2} (ref >59)

## 2020-04-17 LAB — TSH: TSH: 3.2 u[IU]/mL (ref 0.450–4.500)

## 2020-04-17 LAB — CBC
Hematocrit: 41 % (ref 34.0–46.6)
Hemoglobin: 13.5 g/dL (ref 11.1–15.9)
MCH: 31.8 pg (ref 26.6–33.0)
MCHC: 32.9 g/dL (ref 31.5–35.7)
MCV: 97 fL (ref 79–97)
Platelets: 351 10*3/uL (ref 150–450)
RBC: 4.25 x10E6/uL (ref 3.77–5.28)
RDW: 12.2 % (ref 11.7–15.4)
WBC: 5.5 10*3/uL (ref 3.4–10.8)

## 2020-04-17 LAB — LIPID PANEL
Chol/HDL Ratio: 3.1 ratio (ref 0.0–4.4)
Cholesterol, Total: 168 mg/dL (ref 100–199)
HDL: 54 mg/dL (ref >39)
LDL Chol Calc (NIH): 101 mg/dL — ABNORMAL HIGH (ref 0–99)
Triglycerides: 66 mg/dL (ref 0–149)
VLDL Cholesterol Cal: 13 mg/dL (ref 5–40)

## 2020-05-20 ENCOUNTER — Ambulatory Visit (INDEPENDENT_AMBULATORY_CARE_PROVIDER_SITE_OTHER)
Admission: RE | Admit: 2020-05-20 | Discharge: 2020-05-20 | Disposition: A | Discharge status: Home / Self Care | Payer: Self-pay | Source: Ambulatory Visit | Attending: Physician Assistant | Admitting: Physician Assistant

## 2020-05-20 ENCOUNTER — Other Ambulatory Visit: Payer: Self-pay

## 2020-05-20 DIAGNOSIS — R002 Palpitations: Secondary | ICD-10-CM

## 2021-03-11 ENCOUNTER — Emergency Department (HOSPITAL_COMMUNITY): Payer: No Typology Code available for payment source

## 2021-03-11 ENCOUNTER — Emergency Department (HOSPITAL_COMMUNITY)
Admission: EM | Admit: 2021-03-11 | Discharge: 2021-03-12 | Disposition: A | Discharge status: Home / Self Care | Payer: No Typology Code available for payment source | Attending: Emergency Medicine | Admitting: Emergency Medicine

## 2021-03-11 ENCOUNTER — Encounter (HOSPITAL_COMMUNITY): Payer: Self-pay | Admitting: Emergency Medicine

## 2021-03-11 ENCOUNTER — Telehealth: Payer: Self-pay | Admitting: Interventional Cardiology

## 2021-03-11 ENCOUNTER — Other Ambulatory Visit: Payer: Self-pay

## 2021-03-11 DIAGNOSIS — R0789 Other chest pain: Secondary | ICD-10-CM | POA: Diagnosis present

## 2021-03-11 DIAGNOSIS — Z5321 Procedure and treatment not carried out due to patient leaving prior to being seen by health care provider: Secondary | ICD-10-CM | POA: Diagnosis not present

## 2021-03-11 DIAGNOSIS — R202 Paresthesia of skin: Secondary | ICD-10-CM | POA: Insufficient documentation

## 2021-03-11 LAB — CBC
HCT: 41.1 % (ref 36.0–46.0)
Hemoglobin: 13.3 g/dL (ref 12.0–15.0)
MCH: 31.6 pg (ref 26.0–34.0)
MCHC: 32.4 g/dL (ref 30.0–36.0)
MCV: 97.6 fL (ref 80.0–100.0)
Platelets: 362 10*3/uL (ref 150–400)
RBC: 4.21 MIL/uL (ref 3.87–5.11)
RDW: 13.1 % (ref 11.5–15.5)
WBC: 5.9 10*3/uL (ref 4.0–10.5)
nRBC: 0 % (ref 0.0–0.2)

## 2021-03-11 LAB — BASIC METABOLIC PANEL
Anion gap: 13 (ref 5–15)
BUN: 8 mg/dL (ref 6–20)
CO2: 24 mmol/L (ref 22–32)
Calcium: 10.3 mg/dL (ref 8.9–10.3)
Chloride: 100 mmol/L (ref 98–111)
Creatinine, Ser: 0.9 mg/dL (ref 0.44–1.00)
GFR, Estimated: >60 mL/min (ref >60)
Glucose, Bld: 121 mg/dL — ABNORMAL HIGH (ref 70–99)
Potassium: 3.7 mmol/L (ref 3.5–5.1)
Sodium: 137 mmol/L (ref 135–145)

## 2021-03-11 LAB — TROPONIN I (HIGH SENSITIVITY)
Troponin I (High Sensitivity): 3 ng/L (ref <18)
Troponin I (High Sensitivity): 3 ng/L (ref <18)

## 2021-03-11 NOTE — ED Triage Notes (Signed)
Patient arrives ambulatory POV c/o central chest tightness onset of about 1pm today after walking outside. Reports tingling to left arm into fingertips.

## 2021-03-11 NOTE — Telephone Encounter (Signed)
Patient reports she went for a walk this afternoon and afterwards experienced chest tightness and tingling down her left arm.  She denies any SOB, fatigue, chest pressure, nausea, or diaphoresis. She states she took 2 tums and drank some water and the tightness is somewhat relieved. She states she also has anxiety but does not want to dismiss her symptoms for anxiety.  No BP reading, states HR is 77.  Advised patient to go to ED or Urgent Care if symptoms persist or worsen.  Appointment made for patient to see Dr. Tamala Julian 03/17/21 at 8:30AM.  Patient verbalized understanding and agrees with plan above.

## 2021-03-11 NOTE — Telephone Encounter (Signed)
Pt c/o of Chest Pain: STAT if CP now or developed within 24 hours  1. Are you having CP right now?  Lingering chest tightness, denies pain - also mentioned anxiety.  2. Are you experiencing any other symptoms (ex. SOB, nausea, vomiting, sweating)?  Tingling in left arm   3. How long have you been experiencing CP?  Chest tightness developed around 1:00 PM after going on a walk, ongoing   4. Is your CP continuous or coming and going?  Continuous   5. Have you taken Nitroglycerin?  No, patient doesn't have nitro, but states she took 2 tums  ?

## 2021-03-11 NOTE — ED Provider Triage Note (Signed)
[facility] Provider Triage Evaluation Note  Janet Walsh , a 56 y.o. female  was evaluated in triage.  Pt complains of substernal chest pain which she describes as a gas pain.  This started around 4 PM after a walk.  She called her cardiologist who recommended getting evaluated.  She describes similar symptoms in the past which is similar to her anxiety.  Pain is since improved since being in the department.  Review of Systems  Positive:  Negative: See above   Physical Exam  BP (!) 156/96 (BP Location: Right Arm)    Pulse 90    Temp 98.9 F (37.2 C) (Oral)    Resp 16    Ht 5\' 4"  (1.626 m)    Wt 81.6 kg    SpO2 97%    BMI 30.90 kg/m  Gen:   Awake, no distress   Resp:  Normal effort  MSK:   Moves extremities without difficulty  Other:    Medical Decision Making  Medically screening exam initiated at 10:11 PM.  Appropriate orders placed.  DENIS KOPPEL was informed that the remainder of the evaluation will be completed by another provider, this initial triage assessment does not replace that evaluation, and the importance of remaining in the ED until their evaluation is complete.     Chrystie Nose [place], [place] 03/11/21 2212

## 2021-03-12 NOTE — ED Notes (Signed)
Pt called for X2. Pt could not be found.  

## 2021-03-12 NOTE — ED Notes (Signed)
Patient called x4 no answer ?

## 2021-03-16 NOTE — Progress Notes (Signed)
?Cardiology Office Note:   ? ?Date:  03/17/2021  ? ?ID:  Janet Walsh, DOB [birth year], MRN [id number] ? ?PCP:  Macy Mis, MD  ?Cardiologist:  Lesleigh Noe, MD  ? ?Referring MD: Macy Mis, MD  ? ?Chief Complaint  ?Patient presents with  ? Follow-up  ?  Bilateral arm discomfort  ? ? ?History of Present Illness:   ? ?Janet Walsh is a 56 y.o. female with a hx of history of chest pain with prior negative cardiac work-up in 2014 in 2019, GERD, migraines, panic attacks. ?  ?Native of [place]. ? ?Recent ER visit for chest pain.  Seen in the emergency room.  Troponin I was 3. ? ?She had a smoothie that included milk, watermelon, cantaloupe, yogurt, and other ingredients.  30 minutes after that she developed a tight feeling in her chest.  No difficulty swallowing.  Lasted hours.  He eventually went to the emergency room.  EKG and delta troponins were performed and were fine.  The discomfort gradually resolved over 820 to 24-hour timeframe.  It has not recurred.  No prior such symptoms. ? ?Multi year history of left subcostal and subxiphoid chest discomfort characterized as sharp.  Prior work-up for that included CT calcium score.  Calcium score was 0 less than a year ago. ? ?Past Medical History:  ?Diagnosis Date  ? GERD (gastroesophageal reflux disease)   ? Migraines   ? Panic attacks   ? Vertigo   ? ? ?Past Surgical History:  ?Procedure Laterality Date  ? CESAREAN SECTION  1998  ? DILATION AND CURETTAGE OF UTERUS    ? UTERINE FIBROID SURGERY    ? ? ?Current Medications: ?Current Meds  ?Medication Sig  ? diclofenac Sodium (VOLTAREN) 1 % GEL as needed.  ? naproxen (NAPROSYN) 500 MG tablet Take 500 mg by mouth 2 (two) times daily as needed (for severe pain).  ? NON FORMULARY Take 1 tablet by mouth See admin instructions. Vitafusion? MultiVites Gummy Vitamins- Chew 1 gummie by mouth once a day  ? pantoprazole (PROTONIX) 20 MG tablet Take 1 tablet (20 mg total) by mouth daily.  ? sertraline (ZOLOFT)  100 MG tablet Take 100 mg by mouth at bedtime.  ? valACYclovir (VALTREX) 500 MG tablet Take 500 mg by mouth at bedtime.  ?  ? ?Allergies:   Tape  ? ?Social History  ? ?Socioeconomic History  ? Marital status: Single  ?  Spouse name: Not on file  ? Number of children: Not on file  ? Years of education: Not on file  ? Highest education level: Not on file  ?Occupational History  ? Not on file  ?Tobacco Use  ? Smoking status: Never  ? Smokeless tobacco: Never  ?Substance and Sexual Activity  ? Alcohol use: Yes  ?  Comment: occasionally  ? Drug use: No  ? Sexual activity: Not on file  ?Other Topics Concern  ? Not on file  ?Social History Narrative  ? Not on file  ? ?Social Determinants of Health  ? ?Financial Resource Strain: Not on file  ?Food Insecurity: Not on file  ?Transportation Needs: Not on file  ?Physical Activity: Not on file  ?Stress: Not on file  ?Social Connections: Not on file  ?  ? ?Family History: ?The patient's family history includes Heart disease in her maternal uncle; High blood pressure in her sister; Hyperlipidemia in her mother; Hypertension in her mother. ? ?ROS:   ?Please see the history of present illness.    ?  Anxiety concerning her health.  1 maternal uncle with history of stents.  All other systems reviewed and are negative. ? ?EKGs/Labs/Other Studies Reviewed:   ? ?The following studies were reviewed today: ? ?CORONARY CALCIUM SCORE May 20, 2020: ?IMPRESSION: ?Coronary calcium score of 0. ? ?EKG:  EKG was performed on March 11, 2021 and demonstrated normal sinus rhythm with insignificant inferior Q waves.  Overall normal appearance.  ECG performed on 03/17/2021, reveals normal appearance with minimal nausea and significant inferior Q waves.  No change compared to prior T wave abnormality is noted. ? ?Recent Labs: ?04/16/2020: ALT 24; TSH 3.200 ?03/11/2021: BUN 8; Creatinine, Ser 0.90; Hemoglobin 13.3; Platelets 362; Potassium 3.7; Sodium 137  ?Recent Lipid Panel ?   ?Component Value Date/Time  ?  CHOL 168 04/16/2020 0951  ? TRIG 66 04/16/2020 0951  ? HDL 54 04/16/2020 0951  ? CHOLHDL 3.1 04/16/2020 0951  ? LDLCALC 101 (H) 04/16/2020 0951  ? ? ?Physical Exam:   ? ?VS:  Ht 5\' 4"  (1.626 m)   BMI 30.90 kg/m?    ? ?Wt Readings from Last 3 Encounters:  ?03/11/21 180 lb (81.6 kg)  ?04/16/20 181 lb (82.1 kg)  ?11/12/19 176 lb (79.8 kg)  ?  ? ?GEN: Slightly overweight. No acute distress ?HEENT: Normal ?NECK: No JVD. ?LYMPHATICS: No lymphadenopathy ?CARDIAC: 1/6 systolic flow murmur. RRR no gallop, or edema. ?VASCULAR:  Normal Pulses. No bruits. ?RESPIRATORY:  Clear to auscultation without rales, wheezing or rhonchi  ?ABDOMEN: Soft, non-tender, non-distended, No pulsatile mass, ?MUSCULOSKELETAL: No deformity  ?SKIN: Warm and dry ?NEUROLOGIC:  Alert and oriented x 3 ?PSYCHIATRIC:  Normal affect  ? ?ASSESSMENT:   ? ?1. Chest pain of uncertain etiology   ?2. Palpitations   ? ?PLAN:   ? ?In order of problems listed above: ? ?Likely reflux related.  Rule out underlying pericardial/myocardial/congenital abnormality such as hypertrophic cardiomyopathy.  Most likely discomfort was GERD related.  No other type discomfort that she has sounds musculoskeletal and is in the distribution of subcostal nerve irritation. ?No palpitation issues currently. ? ? ? ?The Doppler echocardiogram to exclude pericardial and myocardial/congenital heart problems that could be accounting for the patient's recurrent episodes of chest discomfort. ? ? ?Medication Adjustments/Labs and Tests Ordered: ?Current medicines are reviewed at length with the patient today.  Concerns regarding medicines are outlined above.  ?No orders of the defined types were placed in this encounter. ? ?No orders of the defined types were placed in this encounter. ? ? ?There are no Patient Instructions on file for this visit.  ? ?Signed, ?11/14/19, MD  ?03/17/2021 8:40 AM    ?[facility] ?

## 2021-03-17 ENCOUNTER — Other Ambulatory Visit: Payer: Self-pay

## 2021-03-17 ENCOUNTER — Encounter: Payer: Self-pay | Admitting: Interventional Cardiology

## 2021-03-17 ENCOUNTER — Ambulatory Visit (INDEPENDENT_AMBULATORY_CARE_PROVIDER_SITE_OTHER): Payer: No Typology Code available for payment source | Admitting: Interventional Cardiology

## 2021-03-17 VITALS — BP 128/82 | HR 67 | Ht 64.0 in | Wt 171.8 lb

## 2021-03-17 DIAGNOSIS — R002 Palpitations: Secondary | ICD-10-CM

## 2021-03-17 DIAGNOSIS — R079 Chest pain, unspecified: Secondary | ICD-10-CM

## 2021-03-17 NOTE — Patient Instructions (Signed)
Medication Instructions:  ?Your physician recommends that you continue on your current medications as directed. Please refer to the Current Medication list given to you today. ?  ?*If you need a refill on your cardiac medications before your next appointment, please call your pharmacy* ? ? ?Lab Work: ?None today ?If you have labs (blood work) drawn today and your tests are completely normal, you will receive your results only by: ?MyChart Message (if you have MyChart) OR ?A paper copy in the mail ?If you have any lab test that is abnormal or we need to change your treatment, we will call you to review the results. ? ? ?Testing/Procedures ?Your physician has requested that you have an echocardiogram. Echocardiography is a painless test that uses sound waves to create images of your heart. It provides your doctor with information about the size and shape of your heart and how well your heart?s chambers and valves are working. This procedure takes approximately one hour. There are no restrictions for this procedure. ? ? ? ?Follow-Up: ?At [facility], you and your health needs are our priority.  As part of our continuing mission to provide you with exceptional heart care, we have created designated Provider Care Teams.  These Care Teams include your primary Cardiologist (physician) and Advanced Practice Providers (APPs -  Physician Assistants and Nurse Practitioners) who all work together to provide you with the care you need, when you need it. ? ?We recommend signing up for the patient portal called "MyChart".  Sign up information is provided on this After Visit Summary.  MyChart is used to connect with patients for Virtual Visits (Telemedicine).  Patients are able to view lab/test results, encounter notes, upcoming appointments, etc.  Non-urgent messages can be sent to your provider as well.   ?To learn more about what you can do with MyChart, go to [email or web address].   ? ?Your next appointment:   ?Follow up  as needed. ? ?Provider:   ?Sinclair Grooms, MD   ? ?  ?

## 2021-03-31 ENCOUNTER — Ambulatory Visit (HOSPITAL_COMMUNITY): Payer: No Typology Code available for payment source | Attending: Cardiology

## 2021-03-31 ENCOUNTER — Other Ambulatory Visit: Payer: Self-pay

## 2021-03-31 DIAGNOSIS — R002 Palpitations: Secondary | ICD-10-CM | POA: Diagnosis present

## 2021-03-31 DIAGNOSIS — R079 Chest pain, unspecified: Secondary | ICD-10-CM | POA: Diagnosis not present

## 2021-03-31 LAB — ECHOCARDIOGRAM COMPLETE
Area-P 1/2: 3.42 cm2
S' Lateral: 2.3 cm

## 2021-04-17 ENCOUNTER — Other Ambulatory Visit: Payer: Self-pay | Admitting: Physician Assistant

## 2021-06-03 ENCOUNTER — Ambulatory Visit: Payer: BC Managed Care – PPO | Admitting: Interventional Cardiology

## 2022-02-26 ENCOUNTER — Encounter (HOSPITAL_BASED_OUTPATIENT_CLINIC_OR_DEPARTMENT_OTHER): Payer: Self-pay | Admitting: Orthopaedic Surgery

## 2022-02-26 NOTE — H&P (Signed)
ORTHOPAEDIC SURGERY H&P  Subjective:  The patient presents with left ankle fx.   Past Medical History:  Diagnosis Date   GERD (gastroesophageal reflux disease)    Migraines    Panic attacks    Vertigo     Past Surgical History:  Procedure Laterality Date   CESAREAN SECTION  1998   DILATION AND CURETTAGE OF UTERUS     UTERINE FIBROID SURGERY       (Not in an outpatient encounter)    Allergies  Allergen Reactions   Tape Other (See Comments)    EKG leads- When removed, leave some redness/irritation behind    Social History   Socioeconomic History   Marital status: Single    Spouse name: Not on file   Number of children: Not on file   Years of education: Not on file   Highest education level: Not on file  Occupational History   Not on file  Tobacco Use   Smoking status: Never   Smokeless tobacco: Never  Substance and Sexual Activity   Alcohol use: Yes    Comment: occasionally   Drug use: No   Sexual activity: Not on file  Other Topics Concern   Not on file  Social History Narrative   Not on file   Social Determinants of Health   Financial Resource Strain: Not on file  Food Insecurity: Not on file  Transportation Needs: Not on file  Physical Activity: Not on file  Stress: Not on file  Social Connections: Not on file  Intimate Partner Violence: Not on file     History reviewed. No pertinent family history.   Review of Systems Pertinent items are noted in HPI.  Objective: Vital signs in last 24 hours:    02/26/2022   12:08 PM 03/17/2021    8:32 AM 03/11/2021   10:22 PM  Vitals with BMI  Height 5' 5"$  5' 4"$    Weight 163 lbs 171 lbs 13 oz   BMI [id number]   Systolic  0000000 [id number]  Diastolic  82 85  Pulse  67 67      EXAM: General: Well nourished, well developed. Awake, alert and oriented to time, place, person. Normal mood and affect. No apparent distress. Breathing room air.  Operative Lower Extremity: Alignment - Neutral Deformity -  None Skin intact Tenderness to palpation - left lateral malleolus 5/5 TA, PT, GS, Per, EHL, FHL Sensation intact to light touch throughout Palpable DP and PT pulses Special testing: None  The contralateral foot/ankle was examined for comparison and noted to be neurovascularly intact with no localized deformity, swelling, or tenderness.  Imaging Review All images taken were independently reviewed by me.  Assessment/Plan: The clinical and radiographic findings were reviewed and discussed at length with the patient.  The patient has left ankle ORIF possible syndesmosis and/or deltoid fixation, possible allograft.  We spoke at length about the natural course of these findings. We discussed nonoperative and operative treatment options in detail.  The risks and benefits were presented and reviewed. The risks due to implant failure/irritation, infection, stiffness, nerve/vessel/tendon injury, wound healing issues, failure of this surgery, need for further surgery, thromboembolic events, amputation, death among others were discussed. The patient acknowledged the explanation and agreed to proceed with the plan.  Armond Hang  Orthopaedic Surgery [facility]

## 2022-02-26 NOTE — Discharge Instructions (Addendum)
Janet Hang, MD [facility]  Please read the following information regarding your care after surgery.  Medications  You only need a prescription for the narcotic pain medicine (ex. oxycodone, Percocet, Norco).  All of the other medicines listed below are available over the counter. ? Aleve 2 pills twice a day for the first 3 days after surgery. ? acetominophen (Tylenol) 650 mg every 4-6 hours as you need for minor to moderate pain ? oxycodone as prescribed for severe pain  ? To help prevent blood clots, take aspirin (81 mg) twice daily for 42 days after surgery (or total duration of nonweightbearing).  You should also get up every hour while you are awake to move around.  Weight Bearing ? Do NOT bear any weight on the operated leg or foot. This means do NOT touch your surgical leg to the ground!  Cast / Splint / Dressing ? If you have a splint, do NOT remove this. Keep your splint, cast or dressing clean and dry.  Don't put anything (coat hanger, pencil, etc) down inside of it.  If it gets wet, call the office immediately to schedule an appointment for a cast change.  Swelling IMPORTANT: It is normal for you to have swelling where you had surgery. To reduce swelling and pain, keep at least 3 pillows under your leg so that your toes are above your nose and your heel is above the level of your hip.  It may be necessary to keep your foot or leg elevated for several weeks.  This is critical to helping your incisions heal and your pain to feel better.  Follow Up Call my office at [phone] when you are discharged from the hospital or surgery center to schedule an appointment to be seen 7-10 days after surgery.  Call my office at [phone] if you develop a fever >101.5 F, nausea, vomiting, bleeding from the surgical site or severe pain.     Post Anesthesia Home Care Instructions  Activity: Get plenty of rest for the remainder of the day. A responsible individual must stay with  you for 24 hours following the procedure.  For the next 24 hours, DO NOT: -Drive a car -Paediatric nurse -Drink alcoholic beverages -Take any medication unless instructed by your physician -Make any legal decisions or sign important papers.  Meals: Start with liquid foods such as gelatin or soup. Progress to regular foods as tolerated. Avoid greasy, spicy, heavy foods. If nausea and/or vomiting occur, drink only clear liquids until the nausea and/or vomiting subsides. Call your physician if vomiting continues.  Special Instructions/Symptoms: Your throat may feel dry or sore from the anesthesia or the breathing tube placed in your throat during surgery. If this causes discomfort, gargle with warm salt water. The discomfort should disappear within 24 hours.    Regional Anesthesia Blocks  1. Numbness or the inability to move the "blocked" extremity may last from 3-48 hours after placement. The length of time depends on the medication injected and your individual response to the medication. If the numbness is not going away after 48 hours, call your surgeon.  2. The extremity that is blocked will need to be protected until the numbness is gone and the  Strength has returned. Because you cannot feel it, you will need to take extra care to avoid injury. Because it may be weak, you may have difficulty moving it or using it. You may not know what position it is in without looking at it while the block is in  effect.  3. For blocks in the legs and feet, returning to weight bearing and walking needs to be done carefully. You will need to wait until the numbness is entirely gone and the strength has returned. You should be able to move your leg and foot normally before you try and bear weight or walk. You will need someone to be with you when you first try to ensure you do not fall and possibly risk injury.  4. Bruising and tenderness at the needle site are common side effects and will resolve in a few  days.  5. Persistent numbness or new problems with movement should be communicated to the surgeon or the [facility] [phone] [place] [phone]).

## 2022-03-04 ENCOUNTER — Ambulatory Visit (HOSPITAL_BASED_OUTPATIENT_CLINIC_OR_DEPARTMENT_OTHER)
Admission: RE | Admit: 2022-03-04 | Discharge: 2022-03-04 | Disposition: A | Discharge status: Home / Self Care | Payer: No Typology Code available for payment source | Attending: Orthopaedic Surgery | Admitting: Orthopaedic Surgery

## 2022-03-04 ENCOUNTER — Other Ambulatory Visit: Payer: Self-pay

## 2022-03-04 ENCOUNTER — Encounter (HOSPITAL_BASED_OUTPATIENT_CLINIC_OR_DEPARTMENT_OTHER)
Admission: RE | Disposition: A | Discharge status: Home / Self Care | Payer: Self-pay | Source: Home / Self Care | Attending: Orthopaedic Surgery

## 2022-03-04 ENCOUNTER — Ambulatory Visit (HOSPITAL_COMMUNITY): Payer: No Typology Code available for payment source

## 2022-03-04 ENCOUNTER — Ambulatory Visit (HOSPITAL_BASED_OUTPATIENT_CLINIC_OR_DEPARTMENT_OTHER): Payer: No Typology Code available for payment source | Admitting: Certified Registered"

## 2022-03-04 ENCOUNTER — Encounter (HOSPITAL_BASED_OUTPATIENT_CLINIC_OR_DEPARTMENT_OTHER): Payer: Self-pay | Admitting: Orthopaedic Surgery

## 2022-03-04 DIAGNOSIS — Z01818 Encounter for other preprocedural examination: Secondary | ICD-10-CM

## 2022-03-04 DIAGNOSIS — S8262XA Displaced fracture of lateral malleolus of left fibula, initial encounter for closed fracture: Secondary | ICD-10-CM | POA: Insufficient documentation

## 2022-03-04 DIAGNOSIS — K219 Gastro-esophageal reflux disease without esophagitis: Secondary | ICD-10-CM | POA: Insufficient documentation

## 2022-03-04 DIAGNOSIS — X58XXXA Exposure to other specified factors, initial encounter: Secondary | ICD-10-CM | POA: Insufficient documentation

## 2022-03-04 HISTORY — PX: ORIF ANKLE FRACTURE: SHX5408

## 2022-03-04 SURGERY — OPEN REDUCTION INTERNAL FIXATION (ORIF) ANKLE FRACTURE
Anesthesia: Regional | Site: Ankle | Laterality: Left

## 2022-03-04 MED ORDER — ROPIVACAINE HCL 5 MG/ML IJ SOLN
INTRAMUSCULAR | Status: DC | PRN
  Administered 2022-03-04: 30 mL via PERINEURAL

## 2022-03-04 MED ORDER — DEXMEDETOMIDINE HCL IN NACL 80 MCG/20ML IV SOLN
INTRAVENOUS | Status: AC
  Filled 2022-03-04: qty 20

## 2022-03-04 MED ORDER — FENTANYL CITRATE (PF) 100 MCG/2ML IJ SOLN
100.0000 ug | Freq: Once | INTRAMUSCULAR | Status: AC
  Administered 2022-03-04: 100 ug via INTRAVENOUS

## 2022-03-04 MED ORDER — PROPOFOL 500 MG/50ML IV EMUL
INTRAVENOUS | Status: DC | PRN
  Administered 2022-03-04: 25 ug/kg/min via INTRAVENOUS

## 2022-03-04 MED ORDER — OXYCODONE HCL 5 MG PO TABS
ORAL_TABLET | ORAL | Status: AC
  Filled 2022-03-04: qty 1

## 2022-03-04 MED ORDER — GLYCOPYRROLATE PF 0.2 MG/ML IJ SOSY
PREFILLED_SYRINGE | INTRAMUSCULAR | Status: AC
  Filled 2022-03-04: qty 1

## 2022-03-04 MED ORDER — ONDANSETRON HCL 4 MG/2ML IJ SOLN
INTRAMUSCULAR | Status: DC | PRN
  Administered 2022-03-04: 4 mg via INTRAVENOUS

## 2022-03-04 MED ORDER — CEFAZOLIN SODIUM-DEXTROSE 2-4 GM/100ML-% IV SOLN
2.0000 g | INTRAVENOUS | Status: AC
  Administered 2022-03-04: 2 g via INTRAVENOUS

## 2022-03-04 MED ORDER — PROMETHAZINE HCL 25 MG/ML IJ SOLN: 6.2500 mg | INTRAMUSCULAR | Status: DC | PRN

## 2022-03-04 MED ORDER — HYDROMORPHONE HCL 1 MG/ML IJ SOLN
INTRAMUSCULAR | Status: AC
  Filled 2022-03-04: qty 0.5

## 2022-03-04 MED ORDER — CEFAZOLIN SODIUM-DEXTROSE 2-4 GM/100ML-% IV SOLN
INTRAVENOUS | Status: AC
  Filled 2022-03-04: qty 100

## 2022-03-04 MED ORDER — PHENYLEPHRINE 80 MCG/ML (10ML) SYRINGE FOR IV PUSH (FOR BLOOD PRESSURE SUPPORT)
PREFILLED_SYRINGE | INTRAVENOUS | Status: AC
  Filled 2022-03-04: qty 10

## 2022-03-04 MED ORDER — PHENYLEPHRINE HCL (PRESSORS) 10 MG/ML IV SOLN
INTRAVENOUS | Status: DC | PRN
  Administered 2022-03-04: 80 ug via INTRAVENOUS

## 2022-03-04 MED ORDER — HYDROMORPHONE HCL 1 MG/ML IJ SOLN
0.2500 mg | INTRAMUSCULAR | Status: DC | PRN
  Administered 2022-03-04 (×2): 0.25 mg via INTRAVENOUS

## 2022-03-04 MED ORDER — MIDAZOLAM HCL 2 MG/2ML IJ SOLN
2.0000 mg | Freq: Once | INTRAMUSCULAR | Status: AC
  Administered 2022-03-04: 2 mg via INTRAVENOUS

## 2022-03-04 MED ORDER — LACTATED RINGERS IV SOLN: INTRAVENOUS | Status: DC

## 2022-03-04 MED ORDER — PROPOFOL 10 MG/ML IV BOLUS
INTRAVENOUS | Status: DC | PRN
  Administered 2022-03-04: 200 mg via INTRAVENOUS

## 2022-03-04 MED ORDER — AMISULPRIDE (ANTIEMETIC) 5 MG/2ML IV SOLN: 10.0000 mg | Freq: Once | INTRAVENOUS | Status: DC | PRN

## 2022-03-04 MED ORDER — DEXAMETHASONE SODIUM PHOSPHATE 10 MG/ML IJ SOLN
INTRAMUSCULAR | Status: DC | PRN
  Administered 2022-03-04: 4 mg via INTRAVENOUS

## 2022-03-04 MED ORDER — FENTANYL CITRATE (PF) 100 MCG/2ML IJ SOLN
INTRAMUSCULAR | Status: AC
  Filled 2022-03-04: qty 2

## 2022-03-04 MED ORDER — MIDAZOLAM HCL 2 MG/2ML IJ SOLN
INTRAMUSCULAR | Status: AC
  Filled 2022-03-04: qty 2

## 2022-03-04 MED ORDER — OXYCODONE HCL 5 MG PO TABS
5.0000 mg | ORAL_TABLET | Freq: Once | ORAL | Status: AC | PRN
  Administered 2022-03-04: 5 mg via ORAL

## 2022-03-04 MED ORDER — 0.9 % SODIUM CHLORIDE (POUR BTL) OPTIME
TOPICAL | Status: DC | PRN
  Administered 2022-03-04: 400 mL

## 2022-03-04 MED ORDER — OXYCODONE HCL 5 MG/5ML PO SOLN: 5.0000 mg | Freq: Once | ORAL | Status: AC | PRN

## 2022-03-04 MED ORDER — PROPOFOL 500 MG/50ML IV EMUL
INTRAVENOUS | Status: AC
  Filled 2022-03-04: qty 50

## 2022-03-04 MED ORDER — LIDOCAINE HCL (CARDIAC) PF 100 MG/5ML IV SOSY
PREFILLED_SYRINGE | INTRAVENOUS | Status: DC | PRN
  Administered 2022-03-04: 30 mg via INTRAVENOUS

## 2022-03-04 MED ORDER — POVIDONE-IODINE 10 % EX SWAB
CUTANEOUS | Status: DC | PRN
  Administered 2022-03-04: 1 via TOPICAL

## 2022-03-04 MED ORDER — VANCOMYCIN HCL 500 MG IV SOLR
INTRAVENOUS | Status: DC | PRN
  Administered 2022-03-04: 500 mg via TOPICAL

## 2022-03-04 MED ORDER — MEPERIDINE HCL 25 MG/ML IJ SOLN: 6.2500 mg | INTRAMUSCULAR | Status: DC | PRN

## 2022-03-04 SURGICAL SUPPLY — 81 items
APL PRP STRL LF DISP 70% ISPRP (MISCELLANEOUS) ×2
BANDAGE ESMARK 6X9 LF (GAUZE/BANDAGES/DRESSINGS) ×1 IMPLANT
BIT DRILL 2.5X2.75 QC CALB (BIT) IMPLANT
BIT DRILL 3.5X5.5 QC CALB (BIT) IMPLANT
BLADE SURG 15 STRL LF DISP TIS (BLADE) ×4 IMPLANT
BLADE SURG 15 STRL SS (BLADE) ×4
BNDG CMPR 5X4 CHSV STRCH STRL (GAUZE/BANDAGES/DRESSINGS) ×1
BNDG CMPR 6"X 5 YARDS HK CLSR (GAUZE/BANDAGES/DRESSINGS) ×1
BNDG CMPR 9X6 STRL LF SNTH (GAUZE/BANDAGES/DRESSINGS) ×1
BNDG COHESIVE 4X5 TAN STRL LF (GAUZE/BANDAGES/DRESSINGS) ×1 IMPLANT
BNDG ELASTIC 4X5.8 VLCR STR LF (GAUZE/BANDAGES/DRESSINGS) ×2 IMPLANT
BNDG ELASTIC 6INX 5YD STR LF (GAUZE/BANDAGES/DRESSINGS) ×1 IMPLANT
BNDG ESMARK 6X9 LF (GAUZE/BANDAGES/DRESSINGS) ×1
BNDG GAUZE DERMACEA FLUFF 4 (GAUZE/BANDAGES/DRESSINGS) ×1 IMPLANT
BNDG GZE DERMACEA 4 6PLY (GAUZE/BANDAGES/DRESSINGS) ×1
BRUSH SCRUB EZ  4% CHG (MISCELLANEOUS) ×1
BRUSH SCRUB EZ 4% CHG (MISCELLANEOUS) ×1 IMPLANT
CANISTER SUCT 1200ML W/VALVE (MISCELLANEOUS) ×1 IMPLANT
CHLORAPREP W/TINT 26 (MISCELLANEOUS) ×2 IMPLANT
COVER BACK TABLE 60X90IN (DRAPES) ×1 IMPLANT
CUFF TOURN SGL QUICK 34 (TOURNIQUET CUFF) ×1
CUFF TRNQT CYL 34X4.125X (TOURNIQUET CUFF) ×1 IMPLANT
DRAPE C-ARM 42X72 X-RAY (DRAPES) ×1 IMPLANT
DRAPE C-ARMOR (DRAPES) ×1 IMPLANT
DRAPE EXTREMITY T 121X128X90 (DISPOSABLE) ×1 IMPLANT
DRAPE IMP U-DRAPE 54X76 (DRAPES) ×1 IMPLANT
DRAPE U-SHAPE 47X51 STRL (DRAPES) ×1 IMPLANT
DRSG MEPITEL 4X7.2 (GAUZE/BANDAGES/DRESSINGS) ×1 IMPLANT
ELECT REM PT RETURN 9FT ADLT (ELECTROSURGICAL) ×1
ELECTRODE REM PT RTRN 9FT ADLT (ELECTROSURGICAL) ×1 IMPLANT
FIXATION ZIPTIGHT ANKLE SNDSMS (Ankle) IMPLANT
GAUZE PAD ABD 8X10 STRL (GAUZE/BANDAGES/DRESSINGS) ×5 IMPLANT
GAUZE SPONGE 4X4 12PLY STRL (GAUZE/BANDAGES/DRESSINGS) ×1 IMPLANT
GLOVE BIOGEL PI IND STRL 8 (GLOVE) ×1 IMPLANT
GLOVE SURG SS PI 7.5 STRL IVOR (GLOVE) ×2 IMPLANT
GOWN STRL REUS W/ TWL LRG LVL3 (GOWN DISPOSABLE) ×2 IMPLANT
GOWN STRL REUS W/TWL LRG LVL3 (GOWN DISPOSABLE) ×2
K-WIRE ACE 1.6X6 (WIRE) ×2
KWIRE ACE 1.6X6 (WIRE) IMPLANT
MARKER SKIN DUAL TIP RULER LAB (MISCELLANEOUS) IMPLANT
NDL HYPO 25X1 1.5 SAFETY (NEEDLE) IMPLANT
NEEDLE HYPO 25X1 1.5 SAFETY (NEEDLE) IMPLANT
NS IRRIG 1000ML POUR BTL (IV SOLUTION) ×1 IMPLANT
PACK BASIN DAY SURGERY FS (CUSTOM PROCEDURE TRAY) ×1 IMPLANT
PAD CAST 4YDX4 CTTN HI CHSV (CAST SUPPLIES) ×1 IMPLANT
PADDING CAST ABS COTTON 4X4 ST (CAST SUPPLIES) IMPLANT
PADDING CAST COTTON 4X4 STRL (CAST SUPPLIES) ×1
PADDING CAST SYNTHETIC 4X4 STR (CAST SUPPLIES) ×2 IMPLANT
PADDING CAST SYNTHETIC 6X4 NS (CAST SUPPLIES) ×2 IMPLANT
PENCIL SMOKE EVACUATOR (MISCELLANEOUS) ×1 IMPLANT
PLATE LOCK 7H 92 BILAT FIB (Plate) IMPLANT
SCREW CORTICAL 3.5MM 24MM (Screw) IMPLANT
SCREW LOCK 3.5X14 DIST TIB (Screw) IMPLANT
SCREW LOCK CORT STAR 3.5X12 (Screw) IMPLANT
SCREW LOCK CORT STAR 3.5X14 (Screw) IMPLANT
SCREW NON LOCKING LP 3.5 14MM (Screw) IMPLANT
SHEET MEDIUM DRAPE 40X70 STRL (DRAPES) ×1 IMPLANT
SLEEVE SCD COMPRESS KNEE MED (STOCKING) ×1 IMPLANT
SPIKE FLUID TRANSFER (MISCELLANEOUS) IMPLANT
SPLINT PLASTER CAST FAST 5X30 (CAST SUPPLIES) ×20 IMPLANT
SPONGE T-LAP 18X18 ~~LOC~~+RFID (SPONGE) ×1 IMPLANT
STOCKINETTE 6  STRL (DRAPES) ×1
STOCKINETTE 6 STRL (DRAPES) ×1 IMPLANT
STOCKINETTE ORTHO 6X25 (MISCELLANEOUS) ×1 IMPLANT
SUCTION FRAZIER HANDLE 10FR (MISCELLANEOUS) ×1
SUCTION TUBE FRAZIER 10FR DISP (MISCELLANEOUS) IMPLANT
SUT ETHILON 2 0 FS 18 (SUTURE) ×2 IMPLANT
SUT MNCRL AB 3-0 PS2 18 (SUTURE) ×1 IMPLANT
SUT PDS 3-0 CT2 (SUTURE)
SUT PDS II 3-0 CT2 27 ABS (SUTURE) IMPLANT
SUT VIC AB 2-0 SH 27 (SUTURE) ×1
SUT VIC AB 2-0 SH 27XBRD (SUTURE) IMPLANT
SUT VIC AB 3-0 SH 27 (SUTURE)
SUT VIC AB 3-0 SH 27X BRD (SUTURE) IMPLANT
SUT VICRYL 0 SH 27 (SUTURE) IMPLANT
SYR BULB IRRIG 60ML STRL (SYRINGE) ×1 IMPLANT
SYR CONTROL 10ML LL (SYRINGE) IMPLANT
TOWEL GREEN STERILE FF (TOWEL DISPOSABLE) ×2 IMPLANT
TUBE CONNECTING 20X1/4 (TUBING) ×1 IMPLANT
UNDERPAD 30X36 HEAVY ABSORB (UNDERPADS AND DIAPERS) ×1 IMPLANT
ZIPTIGHT ANKLE SYNODESMOSS FIX (Ankle) ×1 IMPLANT

## 2022-03-04 NOTE — Transfer of Care (Signed)
Immediate Anesthesia Transfer of Care Note  Patient: Janet Walsh  Procedure(s) Performed: Left ankle lateral malleolus ORIF, possible syndesmosis and/or deltoid fixation, possible allograft (Left: Ankle)  Patient Location: PACU  Anesthesia Type:GA combined with regional for post-op pain  Level of Consciousness: drowsy and patient cooperative  Airway & Oxygen Therapy: Patient Spontanous Breathing and Patient connected to face mask oxygen  Post-op Assessment: Report given to RN and Post -op Vital signs reviewed and stable  Post vital signs: Reviewed and stable  Last Vitals:  Vitals Value Taken Time  BP 112/71 03/04/22 1440  Temp    Pulse 66 03/04/22 1440  Resp 18 03/04/22 1440  SpO2 100 % 03/04/22 1440    Last Pain:  Vitals:   03/04/22 0952  TempSrc: Oral  PainSc: 2       Patients Stated Pain Goal: 2 ([id number])  Complications: No notable events documented.

## 2022-03-04 NOTE — Anesthesia Procedure Notes (Signed)
Procedure Name: LMA Insertion Date/Time: 03/04/2022 1:20 PM  Performed by: Kandon Hosking, Ernesta Amble, CRNAPre-anesthesia Checklist: Patient identified, Emergency Drugs available, Suction available and Patient being monitored Patient Re-evaluated:Patient Re-evaluated prior to induction Oxygen Delivery Method: Circle system utilized Preoxygenation: Pre-oxygenation with 100% oxygen Induction Type: IV induction Ventilation: Mask ventilation without difficulty LMA: LMA inserted LMA Size: 4.0 Number of attempts: 1 Airway Equipment and Method: Bite block Placement Confirmation: positive ETCO2 Tube secured with: Tape Dental Injury: Teeth and Oropharynx as per pre-operative assessment

## 2022-03-04 NOTE — Anesthesia Procedure Notes (Signed)
Anesthesia Regional Block: Popliteal block   Pre-Anesthetic Checklist: , timeout performed,  Correct Patient, Correct Site, Correct Laterality,  Correct Procedure, Correct Position, site marked,  Risks and benefits discussed,  Surgical consent,  Pre-op evaluation,  At surgeon's request and post-op pain management  Laterality: Left  Prep: chloraprep       Needles:  Injection technique: Single-shot  Needle Type: Stimiplex     Needle Length: 9cm  Needle Gauge: 21     Additional Needles:   Procedures:,,,, ultrasound used (permanent image in chart),,    Narrative:  Start time: 03/04/2022 11:58 AM End time: 03/04/2022 12:03 PM Injection made incrementally with aspirations every 5 mL.  Performed by: Personally  Anesthesiologist: Lynda Rainwater, MD

## 2022-03-04 NOTE — Anesthesia Preprocedure Evaluation (Signed)
Anesthesia Evaluation  Patient identified by MRN, date of birth, ID band Patient awake    Reviewed: Allergy & Precautions, H&P , NPO status , Patient's Chart, lab work & pertinent test results  Airway Mallampati: II  TM Distance: >3 FB Neck ROM: Full    Dental no notable dental hx.    Pulmonary neg pulmonary ROS   Pulmonary exam normal breath sounds clear to auscultation       Cardiovascular negative cardio ROS Normal cardiovascular exam Rhythm:Regular Rate:Normal     Neuro/Psych  Headaches  Anxiety      negative psych ROS   GI/Hepatic Neg liver ROS,GERD  ,,  Endo/Other  negative endocrine ROS    Renal/GU negative Renal ROS  negative genitourinary   Musculoskeletal negative musculoskeletal ROS (+)    Abdominal   Peds negative pediatric ROS (+)  Hematology negative hematology ROS (+)   Anesthesia Other Findings   Reproductive/Obstetrics negative OB ROS                             Anesthesia Physical Anesthesia Plan  ASA: 2  Anesthesia Plan: General and Regional   Post-op Pain Management: Regional block* and Minimal or no pain anticipated   Induction: Intravenous  PONV Risk Score and Plan: 3 and Ondansetron, Dexamethasone, Midazolam and Treatment may vary due to age or medical condition  Airway Management Planned: LMA  Additional Equipment:   Intra-op Plan:   Post-operative Plan: Extubation in OR  Informed Consent: I have reviewed the patients History and Physical, chart, labs and discussed the procedure including the risks, benefits and alternatives for the proposed anesthesia with the patient or authorized representative who has indicated his/her understanding and acceptance.     Dental advisory given  Plan Discussed with: CRNA  Anesthesia Plan Comments:        Anesthesia Quick Evaluation

## 2022-03-04 NOTE — Progress Notes (Signed)
Assisted Dr. Sabra Heck with left, popliteal, ultrasound guided block. Side rails up, monitors on throughout procedure. See vital signs in flow sheet. Tolerated Procedure well.

## 2022-03-04 NOTE — H&P (Signed)

## 2022-03-04 NOTE — Op Note (Signed)
03/04/2022  3:04 PM   PATIENT: Janet Walsh  57 y.o. female  MRN: [id number]   PRE-OPERATIVE DIAGNOSIS:   Closed fracture of lateral malleolus of left fibula   POST-OPERATIVE DIAGNOSIS:   Closed fracture of lateral malleolus of left fibula   PROCEDURE: Open reduction internal fixation of the left distal fibula Open reduction internal fixation of the left ankle syndesmosis   SURGEON:  Armond Hang, MD   ASSISTANT: None   ANESTHESIA: General, regional   EBL: Minimal   TOURNIQUET:    Total Tourniquet Time Documented: Thigh (Left) - 54 minutes Total: Thigh (Left) - 54 minutes    COMPLICATIONS: None apparent   DISPOSITION: Extubated, awake and stable to recovery.   INDICATION FOR PROCEDURE: The patient presented with above diagnosis.  We discussed the diagnosis, alternative treatment options, risks and benefits of the above surgical intervention, as well as alternative non-operative treatments. All questions/concerns were addressed and the patient/family demonstrated appropriate understanding of the diagnosis, the procedure, the postoperative course, and overall prognosis. The patient wished to proceed with surgical intervention and signed an informed surgical consent as such, in each others presence prior to surgery.   PROCEDURE IN DETAIL: After preoperative consent was obtained and the correct operative site was identified, the patient was brought to the operating room supine on stretcher and transferred onto operating table. General anesthesia was induced. Preoperative antibiotics were administered. Surgical timeout was taken. The patient was then positioned supine with an ipsilateral hip bump. The operative lower extremity was prepped and draped in standard sterile fashion with a tourniquet around the thigh. The extremity was exsanguinated and the tourniquet was inflated to 275 mmHg.  A standard lateral incision was made over the distal fibula.  Dissection was carried down to the level of the fibula and the fracture site identified. The superficial peroneal nerve was identified and protected throughout the procedure. The fibula was noted to be shortened with interposed periosteum. The fibula was brought out to length. The fibula fracture was debrided and the edges defined to achieve cortical read. Reduction maneuver was performed using pointed reduction forceps and lobster forceps. In this manner, the fibula length was restored and fracture reduced. A lag screw was placed given the orientation of fracture lines. Due to poor bone quality and extensive comminution at the fracture site, it was decided to use a locking distal fibula plate. We then selected a Zimmer locking plate to match the anatomy of the distal fibula and placed it laterally. This was implanted under intraoperative fluoroscopy with a combination of distal locking screws and proximal cortical & locking screws.  A manual external rotation stress radiograph was obtained and demonstrated widening of the ankle mortise. Given this intraoperative finding as well as preoperative subluxation, it was decided to reduce and fix the syndesmosis. Therefore a suture fixation system (ZipTight device) was implanted through the fibula plate in cannulated fashion to fix the syndesmosis. Anchor/button position was verified along anteromedial tibial cortex by fluoroscopy. A repeat stress radiograph showed complete stability of the ankle mortise to testing.  The surgical sites were thoroughly irrigated. The tourniquet was deflated and hemostasis achieved. The deep layers were closed using 2-0 vicryl. The skin was closed without tension using 2-0 nylon suture.    The leg was cleaned with saline and sterile dressings with gauze were applied. A well padded bulky short leg splint was applied. The patient was awakened from anesthesia and transported to the recovery room in stable condition.    FOLLOW UP  PLAN: -transfer to PACU, then home -strict NWB operative extremity, maximum elevation -maintain short leg splint until follow up -DVT ppx: Aspirin 81 mg twice daily while NWB -follow up as outpatient within 7-10 days for wound check with exchange of short leg splint to short leg cast -sutures out in 2-3 weeks in outpatient office   RADIOGRAPHS: AP, lateral, oblique and stress radiographs of the left ankle were obtained intraoperatively. These showed interval reduction and fixation of the fractures. Manual stress radiographs were taken and the ankle mortise and tibiofibular relationship were noted to be stable following fixation. All hardware is appropriately positioned and of the appropriate lengths. No other acute injuries are noted.   Armond Hang Orthopaedic Surgery [facility]

## 2022-03-05 ENCOUNTER — Encounter (HOSPITAL_BASED_OUTPATIENT_CLINIC_OR_DEPARTMENT_OTHER): Payer: Self-pay | Admitting: Orthopaedic Surgery

## 2022-03-05 NOTE — Anesthesia Postprocedure Evaluation (Signed)
Anesthesia Post Note  Patient: Janet Walsh  Procedure(s) Performed: Left ankle lateral malleolus ORIF, possible syndesmosis and/or deltoid fixation, possible allograft (Left: Ankle)     Patient location during evaluation: PACU Anesthesia Type: Regional and General Level of consciousness: awake Pain management: pain level controlled Vital Signs Assessment: post-procedure vital signs reviewed and stable Respiratory status: spontaneous breathing, nonlabored ventilation and respiratory function stable Cardiovascular status: blood pressure returned to baseline and stable Postop Assessment: no apparent nausea or vomiting Anesthetic complications: no   No notable events documented.  Last Vitals:  Vitals:   03/04/22 1545 03/04/22 1553  BP:  133/75  Pulse: 72 72  Resp: 14 18  Temp:  (!) 36.2 C  SpO2:  96%    Last Pain:  Vitals:   03/04/22 1553  TempSrc: Oral  PainSc: 8                  Evi Mccomb P Tyriq Moragne

## 2022-03-23 ENCOUNTER — Telehealth: Payer: Self-pay | Admitting: Physician Assistant

## 2022-03-23 NOTE — Telephone Encounter (Signed)
  Pt c/o of Chest Pain: STAT if CP now or developed within 24 hours  1. Are you having CP right now? No   2. Are you experiencing any other symptoms (ex. SOB, nausea, vomiting, sweating)?   3. How long have you been experiencing CP?   4. Is your CP continuous or coming and going?   5. Have you taken Nitroglycerin? No  ?  Pt been feeling anxious and she is feeling heaviness on her chest and a little SOB she is not sure if this is something wrong with her heart. Pt made an apt with Tessa on 03/25/22 at 1:30 pm.

## 2022-03-23 NOTE — Telephone Encounter (Unsigned)
Patient states she is not having any chest pain but felt overwhelmed. She stated he opened her email and have over 100 emails and then her boss doesn't understand. She denies any chest pain or discomfort, shortness of breath, or dizziness. She is unable to check her blood pressure and her watch say her heart rate is at 74. She also stated that even though she has no symptoms she thinks if she stands up she will pass out. I had her stand slowly while we were on the phone and she did past out. She states she feels like she get overwhelmed and then think of all things that can go wrong. She also stated she just don't want to say it's her anxiety and just rule out her heart.  She has appointment with Johann Capers on Wednesday. I also informed her that she need to consult with her PCP about her increasing anxiety.

## 2022-03-25 ENCOUNTER — Ambulatory Visit: Payer: No Typology Code available for payment source | Attending: Physician Assistant | Admitting: Physician Assistant

## 2022-03-25 ENCOUNTER — Encounter: Payer: Self-pay | Admitting: Physician Assistant

## 2022-03-25 VITALS — BP 116/68 | HR 105 | Ht 64.0 in | Wt 163.0 lb

## 2022-03-25 DIAGNOSIS — R079 Chest pain, unspecified: Secondary | ICD-10-CM

## 2022-03-25 DIAGNOSIS — K219 Gastro-esophageal reflux disease without esophagitis: Secondary | ICD-10-CM

## 2022-03-25 DIAGNOSIS — F419 Anxiety disorder, unspecified: Secondary | ICD-10-CM | POA: Diagnosis not present

## 2022-03-25 DIAGNOSIS — R002 Palpitations: Secondary | ICD-10-CM

## 2022-03-25 LAB — TROPONIN T: Troponin T (Highly Sensitive): <6 ng/L (ref 0–14)

## 2022-03-25 NOTE — Patient Instructions (Signed)
Medication Instructions:  Your physician recommends that you continue on your current medications as directed. Please refer to the Current Medication list given to you today.  *If you need a refill on your cardiac medications before your next appointment, please call your pharmacy*   Lab Work: Stat troponin today Fasting lipids and lft's next week If you have labs (blood work) drawn today and your tests are completely normal, you will receive your results only by: [place] (if you have MyChart) OR A paper copy in the mail If you have any lab test that is abnormal or we need to change your treatment, we will call you to review the results.   Testing/Procedures: Your physician has requested that you have an echocardiogram. Echocardiography is a painless test that uses sound waves to create images of your heart. It provides your doctor with information about the size and shape of your heart and how well your heart's chambers and valves are working. This procedure takes approximately one hour. There are no restrictions for this procedure. Please do NOT wear cologne, perfume, aftershave, or lotions (deodorant is allowed). Please arrive 15 minutes prior to your appointment time.    Follow-Up: At [facility], you and your health needs are our priority.  As part of our continuing mission to provide you with exceptional heart care, we have created designated Provider Care Teams.  These Care Teams include your primary Cardiologist (physician) and Advanced Practice Providers (APPs -  Physician Assistants and Nurse Practitioners) who all work together to provide you with the care you need, when you need it.   Your next appointment:   3-4 month(s)  Provider:   Werner Lean, MD    Other Instructions Monitor your heart rate at home  Low-Sodium Eating Plan Sodium, which is an element that makes up salt, helps you maintain a healthy balance of fluids in your body. Too much  sodium can increase your blood pressure and cause fluid and waste to be held in your body. Your health care provider or dietitian may recommend following this plan if you have high blood pressure (hypertension), kidney disease, liver disease, or heart failure. Eating less sodium can help lower your blood pressure, reduce swelling, and protect your heart, liver, and kidneys. What are tips for following this plan? Reading food labels The Nutrition Facts label lists the amount of sodium in one serving of the food. If you eat more than one serving, you must multiply the listed amount of sodium by the number of servings. Choose foods with less than 140 mg of sodium per serving. Avoid foods with 300 mg of sodium or more per serving. Shopping  Look for lower-sodium products, often labeled as "low-sodium" or "no salt added." Always check the sodium content, even if foods are labeled as "unsalted" or "no salt added." Buy fresh foods. Avoid canned foods and pre-made or frozen meals. Avoid canned, cured, or processed meats. Buy breads that have less than 80 mg of sodium per slice. Cooking  Eat more home-cooked food and less restaurant, buffet, and fast food. Avoid adding salt when cooking. Use salt-free seasonings or herbs instead of table salt or sea salt. Check with your health care provider or pharmacist before using salt substitutes. Cook with plant-based oils, such as canola, sunflower, or olive oil. Meal planning When eating at a restaurant, ask that your food be prepared with less salt or no salt, if possible. Avoid dishes labeled as brined, pickled, cured, smoked, or made with  soy sauce, miso, or teriyaki sauce. Avoid foods that contain MSG (monosodium glutamate). MSG is sometimes added to [place] food, bouillon, and some canned foods. Make meals that can be grilled, baked, poached, roasted, or steamed. These are generally made with less sodium. General information Most people on this plan  should limit their sodium intake to 1,500-2,000 mg (milligrams) of sodium each day. What foods should I eat? Fruits Fresh, frozen, or canned fruit. Fruit juice. Vegetables Fresh or frozen vegetables. "No salt added" canned vegetables. "No salt added" tomato sauce and paste. Low-sodium or reduced-sodium tomato and vegetable juice. Grains Low-sodium cereals, including oats, puffed wheat and rice, and shredded wheat. Low-sodium crackers. Unsalted rice. Unsalted pasta. Low-sodium bread. Whole-grain breads and whole-grain pasta. Meats and other proteins Fresh or frozen (no salt added) meat, poultry, seafood, and fish. Low-sodium canned tuna and salmon. Unsalted nuts. Dried peas, beans, and lentils without added salt. Unsalted canned beans. Eggs. Unsalted nut butters. Dairy Milk. Soy milk. Cheese that is naturally low in sodium, such as ricotta cheese, fresh mozzarella, or Swiss cheese. Low-sodium or reduced-sodium cheese. Cream cheese. Yogurt. Seasonings and condiments Fresh and dried herbs and spices. Salt-free seasonings. Low-sodium mustard and ketchup. Sodium-free salad dressing. Sodium-free light mayonnaise. Fresh or refrigerated horseradish. Lemon juice. Vinegar. Other foods Homemade, reduced-sodium, or low-sodium soups. Unsalted popcorn and pretzels. Low-salt or salt-free chips. The items listed above may not be a complete list of foods and beverages you can eat. Contact a dietitian for more information. What foods should I avoid? Vegetables Sauerkraut, pickled vegetables, and relishes. Olives. [place] fries. Onion rings. Regular canned vegetables (not low-sodium or reduced-sodium). Regular canned tomato sauce and paste (not low-sodium or reduced-sodium). Regular tomato and vegetable juice (not low-sodium or reduced-sodium). Frozen vegetables in sauces. Grains Instant hot cereals. Bread stuffing, pancake, and biscuit mixes. Croutons. Seasoned rice or pasta mixes. Noodle soup cups. Boxed or  frozen macaroni and cheese. Regular salted crackers. Self-rising flour. Meats and other proteins Meat or fish that is salted, canned, smoked, spiced, or pickled. Precooked or cured meat, such as sausages or meat loaves. [place]ce Salines. Ham. Pepperoni. Hot dogs. Corned beef. Chipped beef. Salt pork. Jerky. Pickled herring. Anchovies and sardines. Regular canned tuna. Salted nuts. Dairy Processed cheese and cheese spreads. Hard cheeses. Cheese curds. Blue cheese. Feta cheese. String cheese. Regular cottage cheese. Buttermilk. Canned milk. Fats and oils Salted butter. Regular margarine. Ghee. Bacon fat. Seasonings and condiments Onion salt, garlic salt, seasoned salt, table salt, and sea salt. Canned and packaged gravies. Worcestershire sauce. Tartar sauce. Barbecue sauce. Teriyaki sauce. Soy sauce, including reduced-sodium. Steak sauce. Fish sauce. Oyster sauce. Cocktail sauce. Horseradish that you find on the shelf. Regular ketchup and mustard. Meat flavorings and tenderizers. Bouillon cubes. Hot sauce. Pre-made or packaged marinades. Pre-made or packaged taco seasonings. Relishes. Regular salad dressings. Salsa. Other foods Salted popcorn and pretzels. Corn chips and puffs. Potato and tortilla chips. Canned or dried soups. Pizza. Frozen entrees and pot pies. The items listed above may not be a complete list of foods and beverages you should avoid. Contact a dietitian for more information. Summary Eating less sodium can help lower your blood pressure, reduce swelling, and protect your heart, liver, and kidneys. Most people on this plan should limit their sodium intake to 1,500-2,000 mg (milligrams) of sodium each day. Canned, boxed, and frozen foods are high in sodium. Restaurant foods, fast foods, and pizza are also very high in sodium. You also get sodium by adding salt to food. Try to cook  at home, eat more fresh fruits and vegetables, and eat less fast food and canned, processed, or prepared foods. This  information is not intended to replace advice given to you by your health care provider. Make sure you discuss any questions you have with your health care provider. Document Revised: 12/05/2018 Document Reviewed: 11/30/2018 Elsevier Patient Education  [place] Many factors influence your heart health, including eating and exercise habits. Heart health is also called coronary health. Coronary risk increases with abnormal blood fat (lipid) levels. A heart-healthy eating plan includes limiting unhealthy fats, increasing healthy fats, limiting salt (sodium) intake, and making other diet and lifestyle changes. What is my plan? Your health care provider may recommend that: You limit your fat intake to _________% or less of your total calories each day. You limit your saturated fat intake to _________% or less of your total calories each day. You limit the amount of cholesterol in your diet to less than _________ mg per day. You limit the amount of sodium in your diet to less than _________ mg per day. What are tips for following this plan? Cooking Cook foods using methods other than frying. Baking, boiling, grilling, and broiling are all good options. Other ways to reduce fat include: Removing the skin from poultry. Removing all visible fats from meats. Steaming vegetables in water or broth. Meal planning  At meals, imagine dividing your plate into fourths: Fill one-half of your plate with vegetables and green salads. Fill one-fourth of your plate with whole grains. Fill one-fourth of your plate with lean protein foods. Eat 2-4 cups of vegetables per day. One cup of vegetables equals 1 cup (91 g) broccoli or cauliflower florets, 2 medium carrots, 1 large bell pepper, 1 large sweet potato, 1 large tomato, 1 medium white potato, 2 cups (150 g) raw leafy greens. Eat 1-2 cups of fruit per day. One cup of fruit equals 1 small apple, 1 large banana, 1 cup (237  g) mixed fruit, 1 large orange,  cup (82 g) dried fruit, 1 cup (240 mL) 100% fruit juice. Eat more foods that contain soluble fiber. Examples include apples, broccoli, carrots, beans, peas, and barley. Aim to get 25-30 g of fiber per day. Increase your consumption of legumes, nuts, and seeds to 4-5 servings per week. One serving of dried beans or legumes equals  cup (90 g) cooked, 1 serving of nuts is  oz (12 almonds, 24 pistachios, or 7 walnut halves), and 1 serving of seeds equals  oz (8 g). Fats Choose healthy fats more often. Choose monounsaturated and polyunsaturated fats, such as olive and canola oils, avocado oil, flaxseeds, walnuts, almonds, and seeds. Eat more omega-3 fats. Choose salmon, mackerel, sardines, tuna, flaxseed oil, and ground flaxseeds. Aim to eat fish at least 2 times each week. Check food labels carefully to identify foods with trans fats or high amounts of saturated fat. Limit saturated fats. These are found in animal products, such as meats, butter, and cream. Plant sources of saturated fats include palm oil, palm kernel oil, and coconut oil. Avoid foods with partially hydrogenated oils in them. These contain trans fats. Examples are stick margarine, some tub margarines, cookies, crackers, and other baked goods. Avoid fried foods. General information Eat more home-cooked food and less restaurant, buffet, and fast food. Limit or avoid alcohol. Limit foods that are high in added sugar and simple starches such as foods made using white refined flour (white breads, pastries, sweets). Lose weight if  you are overweight. Losing just 5-10% of your body weight can help your overall health and prevent diseases such as diabetes and heart disease. Monitor your sodium intake, especially if you have high blood pressure. Talk with your health care provider about your sodium intake. Try to incorporate more vegetarian meals weekly. What foods should I eat? Fruits All fresh, canned (in  natural juice), or frozen fruits. Vegetables Fresh or frozen vegetables (raw, steamed, roasted, or grilled). Green salads. Grains Most grains. Choose whole wheat and whole grains most of the time. Rice and pasta, including brown rice and pastas made with whole wheat. Meats and other proteins Lean, well-trimmed beef, veal, pork, and lamb. Chicken and [place] without skin. All fish and shellfish. Wild duck, rabbit, pheasant, and venison. Egg whites or low-cholesterol egg substitutes. Dried beans, peas, lentils, and tofu. Seeds and most nuts. Dairy Low-fat or nonfat cheeses, including ricotta and mozzarella. Skim or 1% milk (liquid, powdered, or evaporated). Buttermilk made with low-fat milk. Nonfat or low-fat yogurt. Fats and oils Non-hydrogenated (trans-free) margarines. Vegetable oils, including soybean, sesame, sunflower, olive, avocado, peanut, safflower, corn, canola, and cottonseed. Salad dressings or mayonnaise made with a vegetable oil. Beverages Water (mineral or sparkling). Coffee and tea. Unsweetened ice tea. Diet beverages. Sweets and desserts Sherbet, gelatin, and fruit ice. Small amounts of dark chocolate. Limit all sweets and desserts. Seasonings and condiments All seasonings and condiments. The items listed above may not be a complete list of foods and beverages you can eat. Contact a dietitian for more options. What foods should I avoid? Fruits Canned fruit in heavy syrup. Fruit in cream or butter sauce. Fried fruit. Limit coconut. Vegetables Vegetables cooked in cheese, cream, or butter sauce. Fried vegetables. Grains Breads made with saturated or trans fats, oils, or whole milk. Croissants. Sweet rolls. Donuts. High-fat crackers, such as cheese crackers and chips. Meats and other proteins Fatty meats, such as hot dogs, ribs, sausage, bacon, rib-eye roast or steak. High-fat deli meats, such as salami and bologna. Caviar. Domestic duck and goose. Organ meats, such as  liver. Dairy Cream, sour cream, cream cheese, and creamed cottage cheese. Whole-milk cheeses. Whole or 2% milk (liquid, evaporated, or condensed). Whole buttermilk. Cream sauce or high-fat cheese sauce. Whole-milk yogurt. Fats and oils Meat fat, or shortening. Cocoa butter, hydrogenated oils, palm oil, coconut oil, palm kernel oil. Solid fats and shortenings, including bacon fat, salt pork, lard, and butter. Nondairy cream substitutes. Salad dressings with cheese or sour cream. Beverages Regular sodas and any drinks with added sugar. Sweets and desserts Frosting. Pudding. Cookies. Cakes. Pies. Milk chocolate or white chocolate. Buttered syrups. Full-fat ice cream or ice cream drinks. The items listed above may not be a complete list of foods and beverages to avoid. Contact a dietitian for more information. Summary Heart-healthy meal planning includes limiting unhealthy fats, increasing healthy fats, limiting salt (sodium) intake and making other diet and lifestyle changes. Lose weight if you are overweight. Losing just 5-10% of your body weight can help your overall health and prevent diseases such as diabetes and heart disease. Focus on eating a balance of foods, including fruits and vegetables, low-fat or nonfat dairy, lean protein, nuts and legumes, whole grains, and heart-healthy oils and fats. This information is not intended to replace advice given to you by your health care provider. Make sure you discuss any questions you have with your health care provider. Document Revised: 02/03/2021 Document Reviewed: 02/03/2021 Elsevier Patient Education  [place].

## 2022-03-25 NOTE — Progress Notes (Signed)
Office Visit    Patient Name: Janet Walsh Date of Encounter: 03/25/2022  PCP:  Katherina Mires, [place]  Cardiologist:  Werner Lean, MD  Advanced Practice Provider:  No care team member to display Electrophysiologist:  None   HPI    Janet Walsh is a 57 y.o. female with past medical history of chest pain with prior negative cardiac workup in 2014 in 2019, GERD, migraines, panic attacks presents today for follow-up appointment.  She was seen last year.  Prior history includes drinking a smoothie and then 30 minutes after developing a tight feeling in her chest.  No difficulty swallowing.  Lasted a few hours.  Eventually went to the emergency room and EKG and delta troponins were performed and were fine.  Discomfort gradually resolved over a 24-hour timeframe.  Not recurred.  Multiyear history of left subcostal and subxiphoid chest discomfort characterized as sharp.  Prior workup for that included CT calcium score.  Calcium score was 0 less than a year prior (05/2020).  Echocardiogram was performed 03/2021 which showed LVEF 66% and no significant valvular disease, reassuring.  The patient called 3/11 and stated that she was not having any chest pain but felt overwhelmed about work.  She was unable to check her blood pressure and her watch said she was 74 bpm.  She feels like even though she had no symptoms that when she stands up she will pass out.  She feels like she gets overwhelmed and then thinks of all things that can go wrong.  She does not want to rule out her heart and just say it is anxiety alone.  It was also suggested that she consult with her PCP for the anxiety component.  She tells me that she had an episode.  Drank some NyQuil and lay down to sleep since she felt like she was getting sick.  She remembers getting up to use the bathroom and she ended up on the floor, her left ankle.  She is in a cast today.  She felt like the  heat was coming into her body starting in her feet almost like a hot flash.  She had some dizziness.  Seem like she described some presyncope.  She has had a syncopal episode for and went to see neurologist.  No blurry vision and no headache.  Pain on the left side of her chest occasionally under her left chest which is sharp and does not last very long.  Tingling down her left arm and neck into her eye.  She gets nervous because she does not know where the sensations are coming from.  Also has a bulging disc in her neck.  Chest pain is nonexertional.  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  Reports no shortness of breath nor dyspnea on exertion.No edema, orthopnea, PND. Reports no palpitations.    Past Medical History    Past Medical History:  Diagnosis Date   GERD (gastroesophageal reflux disease)    Migraines    Panic attacks    Vertigo    Past Surgical History:  Procedure Laterality Date   CESAREAN SECTION  1998   DILATION AND CURETTAGE OF UTERUS     ORIF ANKLE FRACTURE Left 03/04/2022   Procedure: Left ankle lateral malleolus ORIF, possible syndesmosis and/or deltoid fixation, possible allograft;  Surgeon: Armond Hang, MD;  Location: [facility];  Service: Orthopedics;  Laterality: Left;   UTERINE FIBROID SURGERY      Allergies  Allergies  Allergen Reactions   Tape Other (See Comments)    EKG leads- When removed, leave some redness/irritation behind     EKGs/Labs/Other Studies Reviewed:   The following studies were reviewed today:  03/31/21 echo IMPRESSIONS     1. Left ventricular ejection fraction by 3D volume is 66 %. The left  ventricle has normal function. The left ventricle has no regional wall  motion abnormalities. Left ventricular diastolic parameters were normal.  The average left ventricular global  longitudinal strain is -26.5 %. The global longitudinal strain is normal.   2. Right  ventricular systolic function is normal. The right ventricular  size is normal.   3. The mitral valve is normal in structure. No evidence of mitral valve  regurgitation. No evidence of mitral stenosis.   4. The aortic valve is tricuspid. Aortic valve regurgitation is not  visualized. No aortic stenosis is present.   5. The inferior vena cava is normal in size with greater than 50%  respiratory variability, suggesting right atrial pressure of 3 mmHg.   Comparison(s): No prior Echocardiogram.   FINDINGS   Left Ventricle: Left ventricular ejection fraction by 3D volume is 66 %.  The left ventricle has normal function. The left ventricle has no regional  wall motion abnormalities. The average left ventricular global  longitudinal strain is -26.5 %. The global   longitudinal strain is normal. The left ventricular internal cavity size  was normal in size. There is no left ventricular hypertrophy. Left  ventricular diastolic parameters were normal.   Right Ventricle: The right ventricular size is normal. No increase in  right ventricular wall thickness. Right ventricular systolic function is  normal.   Left Atrium: Left atrial size was normal in size.   Right Atrium: Right atrial size was normal in size.   Pericardium: There is no evidence of pericardial effusion.   Mitral Valve: The mitral valve is normal in structure. No evidence of  mitral valve regurgitation. No evidence of mitral valve stenosis.   Tricuspid Valve: The tricuspid valve is normal in structure. Tricuspid  valve regurgitation is not demonstrated. No evidence of tricuspid  stenosis.   Aortic Valve: The aortic valve is tricuspid. Aortic valve regurgitation is  not visualized. No aortic stenosis is present.   Pulmonic Valve: The pulmonic valve was normal in structure. Pulmonic valve  regurgitation is trivial. No evidence of pulmonic stenosis.   Aorta: The aortic root is normal in size and structure.   Venous: The  inferior vena cava is normal in size with greater than 50%  respiratory variability, suggesting right atrial pressure of 3 mmHg.   IAS/Shunts: No atrial level shunt detected by color flow Doppler.       EKG:  EKG is  ordered today.  The ekg ordered today demonstrates sinus tachycardia, rate 105 bpm  Recent Labs: No results found for requested labs within last 365 days.  Recent Lipid Panel    Component Value Date/Time   CHOL 168 04/16/2020 0951   TRIG 66 04/16/2020 0951   HDL 54 04/16/2020 0951   CHOLHDL 3.1 04/16/2020 0951   LDLCALC 101 (H) 04/16/2020 0951    Home Medications   Current Meds  Medication Sig   aspirin EC 81 MG tablet Take 81 mg by mouth 2 (two) times daily. Swallow whole.   NON FORMULARY Take 1 tablet by mouth See admin instructions.  VitafusionT MultiVites Gummy Vitamins- Chew 1 gummie by mouth once a day   sertraline (ZOLOFT) 100 MG tablet Take 0.5 tablets by mouth daily.   valACYclovir (VALTREX) 500 MG tablet Take 500 mg by mouth 3 times/day as needed-between meals & bedtime.     Review of Systems      All other systems reviewed and are otherwise negative except as noted above.  Physical Exam    VS:  BP 116/68   Pulse (!) 105   Ht '5\' 4"'$  (1.626 m)   Wt 163 lb (73.9 kg)   LMP 01/18/2016   SpO2 98%   BMI 27.98 kg/m  , BMI Body mass index is 27.98 kg/m.  Wt Readings from Last 3 Encounters:  03/25/22 163 lb (73.9 kg)  03/04/22 165 lb 9.1 oz (75.1 kg)  03/17/21 171 lb 12.8 oz (77.9 kg)     GEN: Well nourished, well developed, in no acute distress. HEENT: normal. Neck: Supple, no JVD, carotid bruits, or masses. Cardiac: RRR, no murmurs, rubs, or gallops. No clubbing, cyanosis, edema.  Radials/PT 2+ and equal bilaterally.  Respiratory:  Respirations regular and unlabored, clear to auscultation bilaterally. GI: Soft, nontender, nondistended. MS: No deformity or atrophy. Skin: Warm and dry, no rash. Neuro:  Strength and sensation are intact. Psych:  Normal affect.  Assessment & Plan    History of chest pain -Noncardiac with being sharp and short in duration, nonexertional -Will get a stat troponin and an echocardiogram for further workup -She had a calcium score done in 2022 which was 0, reassuring -Continue current medications including aspirin 81 mg daily, and Zoloft 50 mg daily (she just filled this prescription)  Anxiety -Just started on Zoloft by her primary care        Disposition: Follow up 3 months with Werner Lean, MD or APP.  Signed, Janet Collard, PA-C 03/25/2022, 2:22 PM  [facility]

## 2022-03-30 ENCOUNTER — Ambulatory Visit: Payer: No Typology Code available for payment source | Attending: Physician Assistant

## 2022-03-30 DIAGNOSIS — K219 Gastro-esophageal reflux disease without esophagitis: Secondary | ICD-10-CM

## 2022-03-30 DIAGNOSIS — R002 Palpitations: Secondary | ICD-10-CM

## 2022-03-30 DIAGNOSIS — F419 Anxiety disorder, unspecified: Secondary | ICD-10-CM

## 2022-03-30 DIAGNOSIS — R079 Chest pain, unspecified: Secondary | ICD-10-CM

## 2022-03-31 LAB — LIPID PANEL
Chol/HDL Ratio: 2.9 ratio (ref 0.0–4.4)
Cholesterol, Total: 156 mg/dL (ref 100–199)
HDL: 53 mg/dL (ref >39)
LDL Chol Calc (NIH): 90 mg/dL (ref 0–99)
Triglycerides: 65 mg/dL (ref 0–149)
VLDL Cholesterol Cal: 13 mg/dL (ref 5–40)

## 2022-04-01 IMAGING — DX DG CHEST 2V
2 series · 2 of 2 positions shown · non-contrast
Comparison: July 06, 2019

CLINICAL DATA: Chest pain.

EXAM:
CHEST - 2 VIEW

[chest pa]
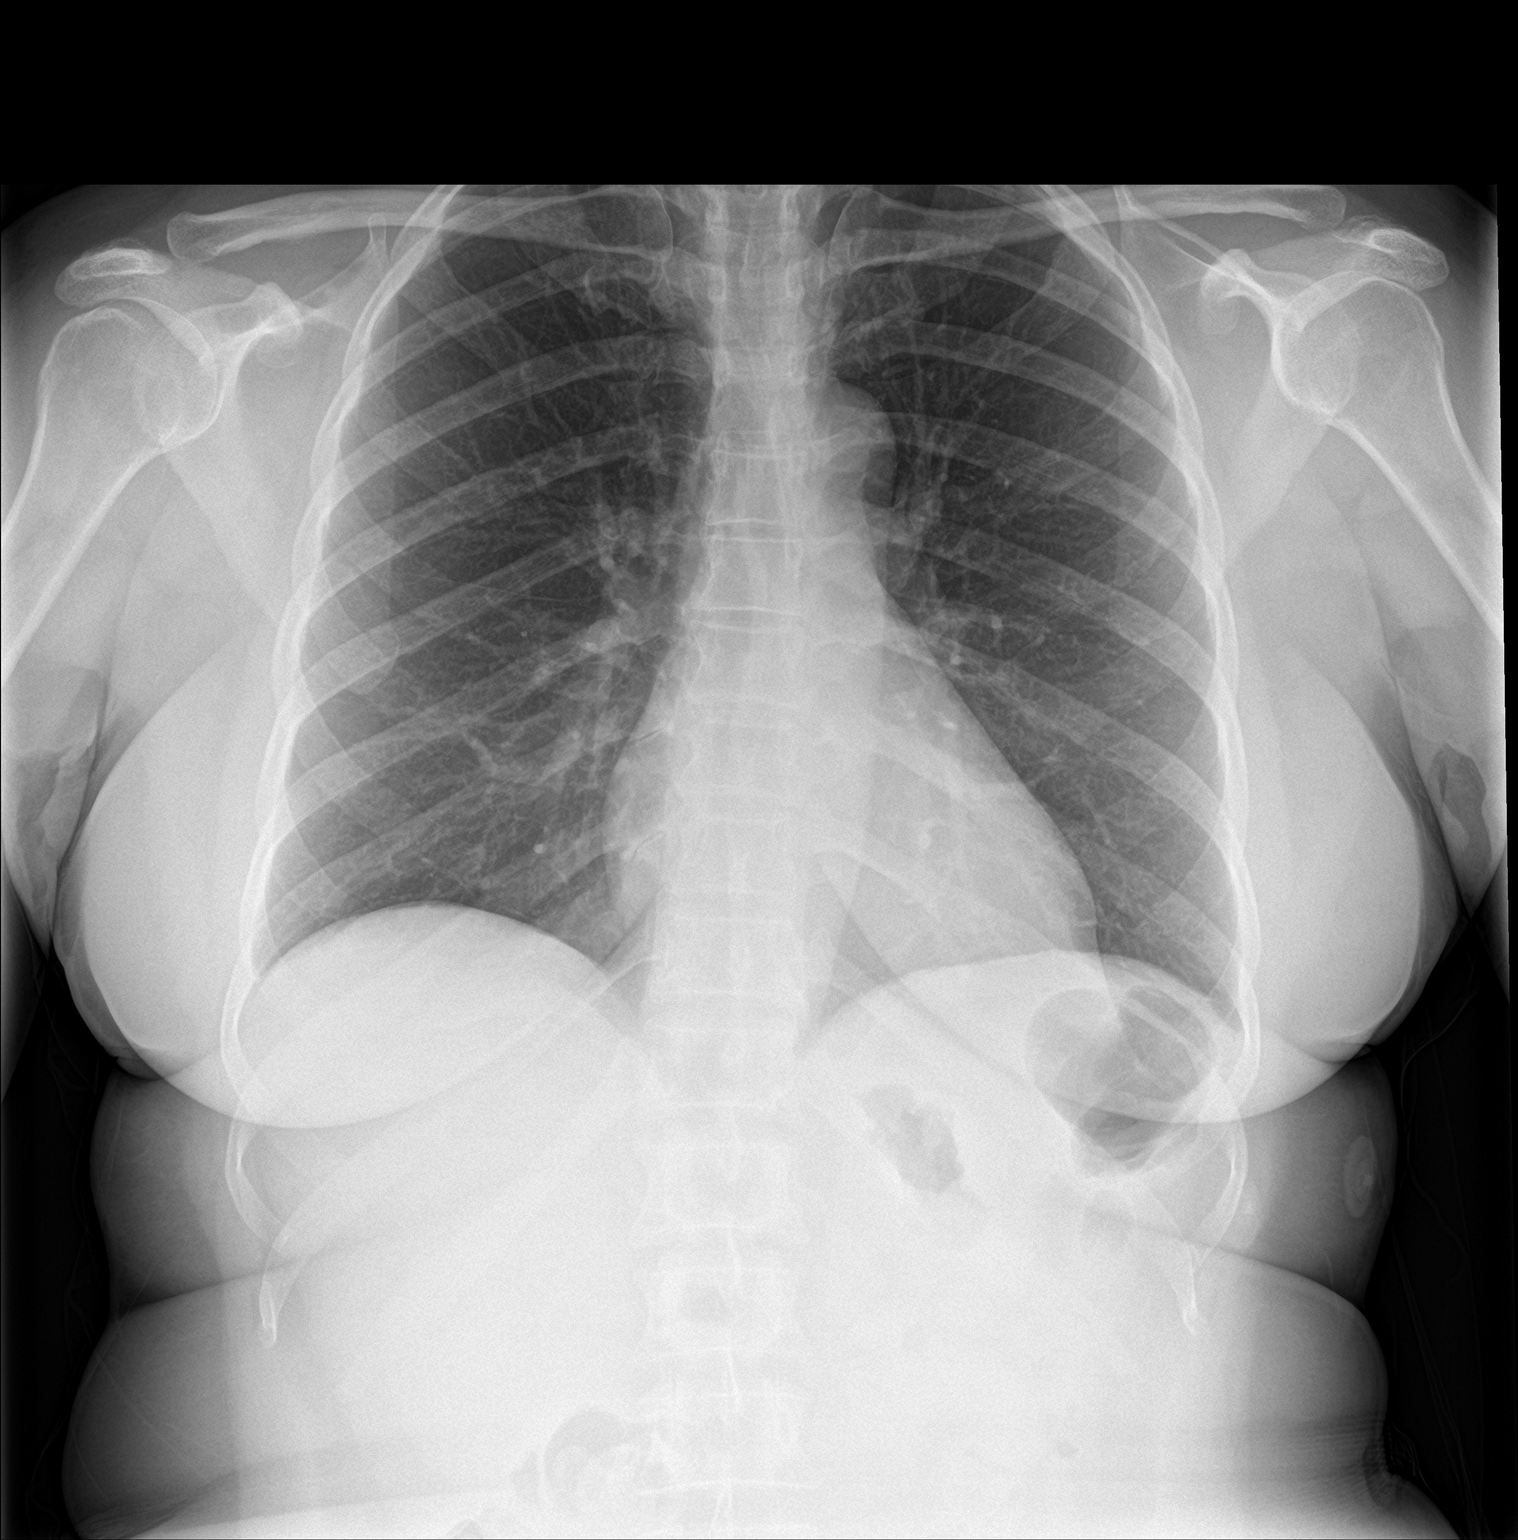

[chest lat]
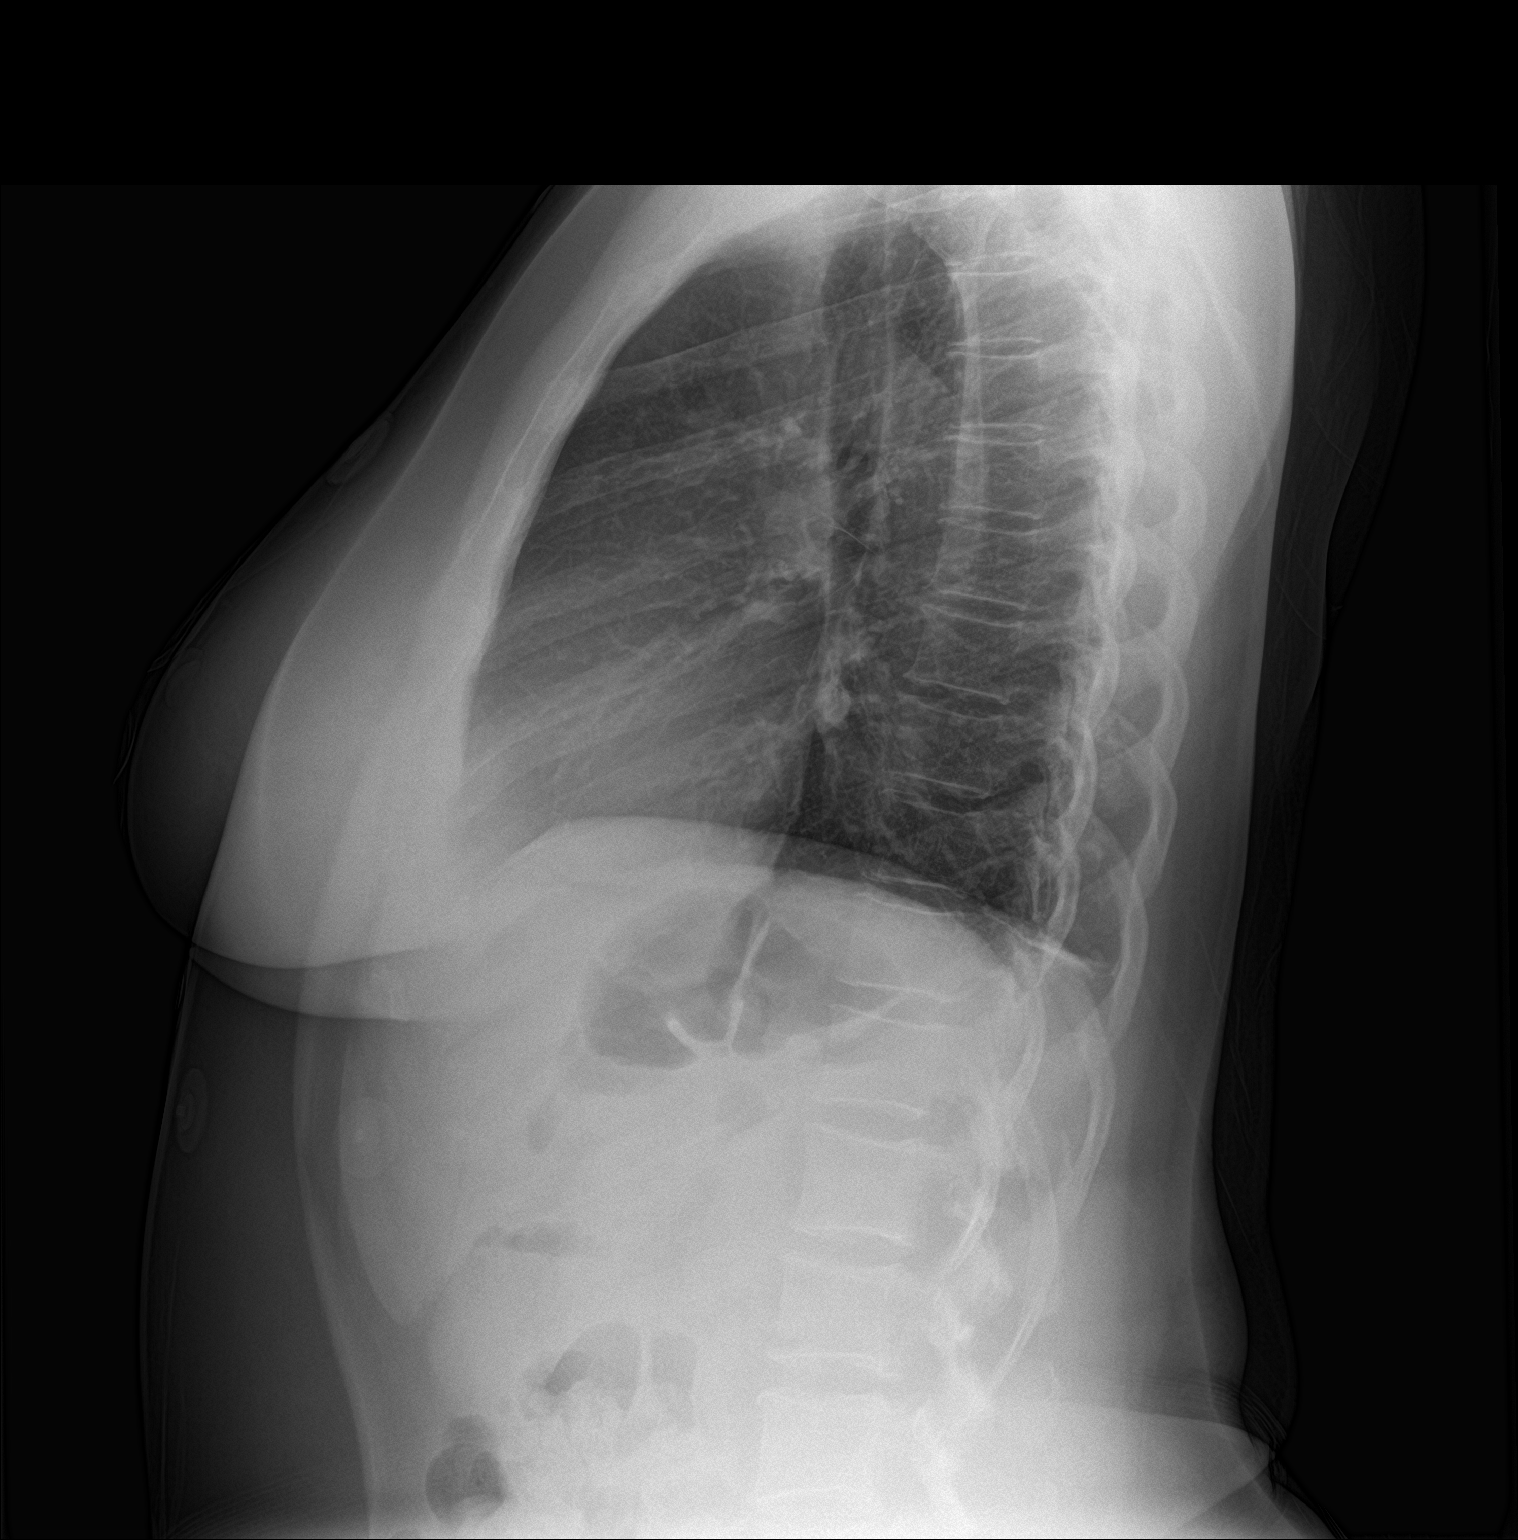

[2 of 2 positions shown; findings below may reference images not displayed]

FINDINGS: The heart size and mediastinal contours are within normal limits.
Both lungs are clear. The visualized skeletal structures are
unremarkable.
IMPRESSION: No active cardiopulmonary disease.

## 2022-04-24 ENCOUNTER — Ambulatory Visit (HOSPITAL_COMMUNITY): Payer: No Typology Code available for payment source | Attending: Physician Assistant

## 2022-04-24 DIAGNOSIS — R079 Chest pain, unspecified: Secondary | ICD-10-CM | POA: Insufficient documentation

## 2022-04-26 LAB — ECHOCARDIOGRAM COMPLETE
Area-P 1/2: 3.08 cm2
S' Lateral: 2.9 cm

## 2022-07-09 NOTE — Progress Notes (Signed)
Cardiology Office Note:  .    Date:  07/13/2022  ID:  Janet Walsh, DOB 17-Dec-1965, MRN [id number] PCP: Macy Mis, MD  [facility] Providers Cardiologist:  Christell Constant, MD     CC: Transition to new cardiologist  History of Present Illness: .    Janet Walsh is a 57 y.o. female with GERD and panic attacks to establish care.  Patient notes that she is having rare chest pain.  They give her anxiety. There are no interval hospital/ED visit.   Last chest pain occurred when she forgot her appt. Also has chest pain with red sauce or rare alcohol.  No SOB/DOE and no PND/Orthopnea.  No weight gain or leg swelling.  No palpitations or syncope .  Relevant histories: .  2014: negative cardiac work up. 2019: negative cardiac work up. 2022: Zero CAC score.  2023: Normal LVEF Social  Had one son and does foster car ROS: As per HPI.   Studies Reviewed: .   Cardiac Studies & Procedures     STRESS TESTS  EXERCISE TOLERANCE TEST (ETT) 03/31/2017  Narrative  The patient walked for a total of 5 minutes and 37 seconds on a Bruce protocol treadmill test. The peak heart rate was 179 which is 107% predicted maximal heart rate.  There were no ST or T wave changes to suggest ischemia. The blood pressure response to exercise was normal. There were no significant arrhythmias.  This is interpreted as a negative stress test. There is no evidence of ischemia.   ECHOCARDIOGRAM  ECHOCARDIOGRAM COMPLETE 04/26/2022  Narrative ECHOCARDIOGRAM REPORT    Patient Name:   Janet Walsh Date of Exam: 04/24/2022 Medical Rec #:  [id number]           Height:       64.0 in Accession #:    [id number]          Weight:       163.0 lb Date of Birth:  02/22/1965           BSA:          1.793 m Patient Age:    56 years            BP:           116/68 mmHg Patient Gender: F                   HR:           66 bpm. Exam Location:  [address]  Procedure: 2D Echo,  Cardiac Doppler, Color Doppler and 3D Echo  Indications:    R07.9 Chest Pain  History:        Patient has prior history of Echocardiogram examinations, most recent 03/31/2021. Signs/Symptoms:Chest Pain, Dizziness/Lightheadedness and Syncope; Risk Factors:Family History of Coronary Artery Disease. Palpitaitons.  Sonographer:    Thurman Coyer RDCS Referring Phys: [id number] TESSA N CONTE  IMPRESSIONS   1. Left ventricular ejection fraction, by estimation, is 60 to 65%. Left ventricular ejection fraction by 3D volume is 62 %. The left ventricle has normal function. The left ventricle has no regional wall motion abnormalities. Left ventricular diastolic parameters were normal. 2. Right ventricular systolic function is normal. The right ventricular size is normal. There is normal pulmonary artery systolic pressure. The estimated right ventricular systolic pressure is 19.6 mmHg. 3. The mitral valve is normal in structure. Trivial mitral valve regurgitation. No evidence of mitral stenosis. 4. The aortic valve is normal in  structure. Aortic valve regurgitation is not visualized. No aortic stenosis is present. 5. The inferior vena cava is normal in size with greater than 50% respiratory variability, suggesting right atrial pressure of 3 mmHg.  FINDINGS Left Ventricle: Left ventricular ejection fraction, by estimation, is 60 to 65%. Left ventricular ejection fraction by 3D volume is 62 %. The left ventricle has normal function. The left ventricle has no regional wall motion abnormalities. The left ventricular internal cavity size was normal in size. There is no left ventricular hypertrophy. Left ventricular diastolic parameters were normal. Normal left ventricular filling pressure.  Right Ventricle: The right ventricular size is normal. No increase in right ventricular wall thickness. Right ventricular systolic function is normal. There is normal pulmonary artery systolic pressure. The tricuspid  regurgitant velocity is 2.04 m/s, and with an assumed right atrial pressure of 3 mmHg, the estimated right ventricular systolic pressure is 19.6 mmHg.  Left Atrium: Left atrial size was normal in size.  Right Atrium: Right atrial size was normal in size.  Pericardium: There is no evidence of pericardial effusion.  Mitral Valve: The mitral valve is normal in structure. Trivial mitral valve regurgitation. No evidence of mitral valve stenosis.  Tricuspid Valve: The tricuspid valve is normal in structure. Tricuspid valve regurgitation is trivial. No evidence of tricuspid stenosis.  Aortic Valve: The aortic valve is normal in structure. Aortic valve regurgitation is not visualized. No aortic stenosis is present.  Pulmonic Valve: The pulmonic valve was normal in structure. Pulmonic valve regurgitation is trivial. No evidence of pulmonic stenosis.  Aorta: The aortic root is normal in size and structure.  Venous: The inferior vena cava is normal in size with greater than 50% respiratory variability, suggesting right atrial pressure of 3 mmHg.  IAS/Shunts: No atrial level shunt detected by color flow Doppler.   LEFT VENTRICLE PLAX 2D LVIDd:         4.10 cm         Diastology LVIDs:         2.90 cm         LV e' medial:    9.14 cm/s LV PW:         1.00 cm         LV E/e' medial:  9.7 LV IVS:        0.80 cm         LV e' lateral:   10.30 cm/s LVOT diam:     1.90 cm         LV E/e' lateral: 8.7 LV SV:         66 LV SV Index:   37 LVOT Area:     2.84 cm        3D Volume EF LV 3D EF:    Left ventricul ar ejection fraction by 3D volume is 62 %.  3D Volume EF: 3D EF:        62 % LV EDV:       95 ml LV ESV:       36 ml LV SV:        59 ml  RIGHT VENTRICLE RV Basal diam:  3.40 cm RV Mid diam:    2.30 cm RV S prime:     11.90 cm/s TAPSE (M-mode): 2.0 cm RVSP:           19.6 mmHg  LEFT ATRIUM             Index        RIGHT  ATRIUM           Index LA diam:        3.10 cm 1.73  cm/m   RA Pressure: 3.00 mmHg LA Vol (A2C):   37.2 ml 20.74 ml/m  RA Area:     15.80 cm LA Vol (A4C):   32.2 ml 17.95 ml/m  RA Volume:   39.20 ml  21.86 ml/m LA Biplane Vol: 36.9 ml 20.57 ml/m AORTIC VALVE LVOT Vmax:   113.00 cm/s LVOT Vmean:  71.100 cm/s LVOT VTI:    0.234 m  AORTA Ao Root diam: 2.80 cm Ao Asc diam:  2.60 cm  MITRAL VALVE               TRICUSPID VALVE MV Area (PHT)  cm         TR Peak grad:   16.6 mmHg MV Decel Time: 246 msec    TR Vmax:        204.00 cm/s MV E velocity: 89.10 cm/s  Estimated RAP:  3.00 mmHg MV A velocity: 72.00 cm/s  RVSP:           19.6 mmHg MV E/A ratio:  1.24 SHUNTS Systemic VTI:  0.23 m Systemic Diam: 1.90 cm  Armanda Magic MD Electronically signed by Armanda Magic MD Signature Date/Time: 04/26/2022/7:13:58 PM    Final    MONITORS  CARDIAC EVENT MONITOR 07/11/2014   CT SCANS  CT CARDIAC SCORING (SELF PAY ONLY) 05/20/2020  Addendum 05/20/2020 11:36 AM ADDENDUM REPORT: 05/20/2020 11:34  CLINICAL DATA:  Cardiovascular disease risk stratification  EXAM: CT Coronary Calcium Score  TECHNIQUE: A gated, non-contrast computed tomography scan of the heart was performed using 3mm slice thickness. Axial images were analyzed on a dedicated workstation. Calcium scoring of the coronary arteries was performed using the Agatston method.  FINDINGS: Coronary arteries: Normal origins.  Coronary Calcium Score:  Left main: 0  Left anterior descending artery: 0  Left circumflex artery: 0  Right coronary artery: 0. Small area in mid RCA which may represent calcification is under the detection threshold for calculation.  Total: 0  Pericardium: Normal.  Trivial posterior pericardial effusion.  Ascending Aorta: Normal caliber. Ascending aorta measures 26mm at the mid ascending aorta measured in an axial plane.  Non-cardiac: See separate report from [facility].  IMPRESSION: Coronary calcium score of  0.  RECOMMENDATIONS: Coronary artery calcium (CAC) score is a strong predictor of incident coronary heart disease (CHD) and provides predictive information beyond traditional risk factors. CAC scoring is reasonable to use in the decision to withhold, postpone, or initiate statin therapy in intermediate-risk or selected borderline-risk asymptomatic adults (age 16-75 years and LDL-C >=70 to <190 mg/dL) who do not have diabetes or established atherosclerotic cardiovascular disease (ASCVD).* In intermediate-risk (10-year ASCVD risk >=7.5% to <20%) adults or selected borderline-risk (10-year ASCVD risk >=5% to <7.5%) adults in whom a CAC score is measured for the purpose of making a treatment decision the following recommendations have been made:  If CAC=0, it is reasonable to withhold statin therapy and reassess in 5 to 10 years, as long as higher risk conditions are absent (diabetes mellitus, family history of premature CHD in first degree relatives (males <55 years; females <65 years), cigarette smoking, or LDL >=190 mg/dL).  If CAC is 1 to 99, it is reasonable to initiate statin therapy for patients >=51 years of age.  If CAC is >=100 or >=75th percentile, it is reasonable to initiate statin therapy at any age.  Cardiology referral  should be considered for patients with CAC scores >=400 or >=75th percentile.  *2018 AHA/ACC/AACVPR/AAPA/ABC/ACPM/ADA/AGS/APhA/ASPC/NLA/PCNA Guideline on the Management of Blood Cholesterol: A Report of the American College of Cardiology/American Heart Association Task Force on Clinical Practice Guidelines. J Am Coll Cardiol. 2019;73(24):3168-3209.   Electronically Signed By: Weston Brass On: 05/20/2020 11:34  Narrative EXAM: OVER-READ INTERPRETATION  CT CHEST  The following report is an over-read performed by radiologist Dr. Charlett Nose of [facility] on 05/20/2020. This over-read does not include interpretation of cardiac or  coronary anatomy or pathology. The coronary calcium score interpretation by the cardiologist is attached.  COMPARISON:  None.  FINDINGS: Vascular: Heart is normal size.  Aorta normal caliber.  Mediastinum/Nodes: No adenopathy.  Lungs/Pleura: No confluent opacities or effusions.  Upper Abdomen: Diffuse low-density throughout the visualized liver compatible with fatty infiltration.  Musculoskeletal: Chest wall soft tissues are unremarkable. No acute bony abnormality.  IMPRESSION: No acute extra cardiac abnormality.  Hepatic steatosis.  Electronically Signed: By: Charlett Nose M.D. On: 05/20/2020 09:54           Physical Exam:    VS:  BP 112/60   Pulse 78   Ht 5\' 4"  (1.626 m)   Wt 166 lb (75.3 kg)   LMP 01/18/2016   SpO2 97%   BMI 28.49 kg/m    Wt Readings from Last 3 Encounters:  07/13/22 166 lb (75.3 kg)  03/25/22 163 lb (73.9 kg)  03/04/22 165 lb 9.1 oz (75.1 kg)    Gen: no distress Neck: No JVD Cardiac: No Rubs or Gallops, no Murmur, RRR +2 radial pulses Respiratory: Clear to auscultation bilaterally, normal effort, normal  respiratory rate GI: Soft, nontender, non-distended  MS: No  edema;  moves all extremities Integument: Skin feels warm Neuro:  At time of evaluation, alert and oriented to person/place/time/situation  Psych: Anxious affect  ASSESSMENT AND PLAN: .    Non cardiac chest pain (two types)  - related to anxiety (type 1) and GERD - Depression improved from when seeing Dr. Katrinka Blazing; continue sertraline   One year with me or Dema Severin, MD FASE [facility] Cardiologist [facility]  [facility]  [address] [place], [address] [place], [place] [address] [phone]  4:57 PM

## 2022-07-13 ENCOUNTER — Encounter: Payer: Self-pay | Admitting: Internal Medicine

## 2022-07-13 ENCOUNTER — Ambulatory Visit: Payer: No Typology Code available for payment source | Attending: Internal Medicine | Admitting: Internal Medicine

## 2022-07-13 VITALS — BP 112/60 | HR 78 | Ht 64.0 in | Wt 166.0 lb

## 2022-07-13 DIAGNOSIS — K219 Gastro-esophageal reflux disease without esophagitis: Secondary | ICD-10-CM | POA: Diagnosis not present

## 2022-07-13 DIAGNOSIS — F419 Anxiety disorder, unspecified: Secondary | ICD-10-CM | POA: Diagnosis not present

## 2022-07-13 DIAGNOSIS — F32A Depression, unspecified: Secondary | ICD-10-CM | POA: Insufficient documentation

## 2022-07-13 NOTE — Patient Instructions (Signed)
Medication Instructions:  Your physician recommends that you continue on your current medications as directed. Please refer to the Current Medication list given to you today.  *If you need a refill on your cardiac medications before your next appointment, please call your pharmacy*   Lab Work: NONE If you have labs (blood work) drawn today and your tests are completely normal, you will receive your results only by: MyChart Message (if you have MyChart) OR A paper copy in the mail If you have any lab test that is abnormal or we need to change your treatment, we will call you to review the results.   Testing/Procedures: NONE   Follow-Up: At [facility], you and your health needs are our priority.  As part of our continuing mission to provide you with exceptional heart care, we have created designated Provider Care Teams.  These Care Teams include your primary Cardiologist (physician) and Advanced Practice Providers (APPs -  Physician Assistants and Nurse Practitioners) who all work together to provide you with the care you need, when you need it.   Your next appointment:   1 year(s)  Provider:   Mahesh Chandrasekhar, MD     

## 2023-05-19 ENCOUNTER — Other Ambulatory Visit: Payer: Self-pay

## 2023-05-19 ENCOUNTER — Encounter (HOSPITAL_BASED_OUTPATIENT_CLINIC_OR_DEPARTMENT_OTHER): Payer: Self-pay | Admitting: Orthopaedic Surgery

## 2023-05-23 NOTE — Discharge Instructions (Signed)
 Ali Ink, MD [facility]  Please read the following information regarding your care after surgery.  Medications  You only need a prescription for the narcotic pain medicine (ex. oxycodone , Percocet, Norco).  All of the other medicines listed below are available over the counter. ? Aleve 2 pills twice a day for the first 3 days after surgery. ? acetominophen (Tylenol) 650 mg every 4-6 hours as you need for minor to moderate pain ? oxycodone  as prescribed for severe pain  ? To help prevent blood clots, take aspirin  (81 mg) twice daily for 28 days after surgery.  You should also get up every hour while you are awake to move around.  Weight Bearing ? OK to walk on the operative leg only AFTER the nerve block has completely worn off.  Cast / Splint / Dressing ? Keep your dressing clean and dry.  Don't put anything (coat hanger, pencil, etc) down inside of it.  If it gets wet, please notify the office immediately.  Swelling IMPORTANT: It is normal for you to have swelling where you had surgery. To reduce swelling and pain, keep at least 3 pillows under your leg so that your toes are above your nose and your heel is above the level of your hip.  It may be necessary to keep your foot or leg elevated for several weeks.  This is critical to helping your incisions heal and your pain to feel better.  Follow Up Call my office at [phone] when you are discharged from the hospital or surgery center to schedule an appointment to be seen within 7-10 days after surgery.  Call my office at [phone] if you develop a fever >101.5 F, nausea, vomiting, bleeding from the surgical site or severe pain.      May take Tylenol after 3 pm, if needed.    Post Anesthesia Home Care Instructions  Activity: Get plenty of rest for the remainder of the day. A responsible individual must stay with you for 24 hours following the procedure.  For the next 24 hours, DO NOT: -Drive a car -Social worker -Drink alcoholic beverages -Take any medication unless instructed by your physician -Make any legal decisions or sign important papers.  Meals: Start with liquid foods such as gelatin or soup. Progress to regular foods as tolerated. Avoid greasy, spicy, heavy foods. If nausea and/or vomiting occur, drink only clear liquids until the nausea and/or vomiting subsides. Call your physician if vomiting continues.  Special Instructions/Symptoms: Your throat may feel dry or sore from the anesthesia or the breathing tube placed in your throat during surgery. If this causes discomfort, gargle with warm salt water. The discomfort should disappear within 24 hours.  If you had a scopolamine patch placed behind your ear for the management of post- operative nausea and/or vomiting:  1. The medication in the patch is effective for 72 hours, after which it should be removed.  Wrap patch in a tissue and discard in the trash. Wash hands thoroughly with soap and water. 2. You may remove the patch earlier than 72 hours if you experience unpleasant side effects which may include dry mouth, dizziness or visual disturbances. 3. Avoid touching the patch. Wash your hands with soap and water after contact with the patch.

## 2023-05-23 NOTE — H&P (Signed)
 ORTHOPAEDIC SURGERY H&P  Subjective:  The patient presents with left ankle hardware and peroneal tendinopathy.   Past Medical History:  Diagnosis Date   Depression    Migraines    Panic attack    Panic attacks    Vertigo     Past Surgical History:  Procedure Laterality Date   CESAREAN SECTION  1998   DILATION AND CURETTAGE OF UTERUS     ORIF ANKLE FRACTURE Left 03/04/2022   Procedure: Left ankle lateral malleolus ORIF, possible syndesmosis and/or deltoid fixation, possible allograft;  Surgeon: Ali Ink, MD;  Location: [facility];  Service: Orthopedics;  Laterality: Left;   UTERINE FIBROID SURGERY       (Not in an outpatient encounter)    Allergies  Allergen Reactions   Tape Other (See Comments)    EKG leads- When removed, leave some redness/irritation behind    Social History   Socioeconomic History   Marital status: Single    Spouse name: Not on file   Number of children: Not on file   Years of education: Not on file   Highest education level: Not on file  Occupational History   Not on file  Tobacco Use   Smoking status: Never   Smokeless tobacco: Never  Vaping Use   Vaping status: Never Used  Substance and Sexual Activity   Alcohol use: Yes    Comment: occasionally   Drug use: No   Sexual activity: Not Currently    Birth control/protection: None, Post-menopausal  Other Topics Concern   Not on file  Social History Narrative   Not on file   Social Drivers of Health   Financial Resource Strain: Low Risk  (02/19/2022)   Received from [facility]   Overall Financial Resource Strain (CARDIA)    Difficulty of Paying Living Expenses: Not hard at all  Food Insecurity: No Food Insecurity (02/19/2022)   Received from [facility], [facility]   Hunger Vital Sign    Worried About Running Out of Food in the Last Year: Never true    Ran Out of Food in the Last Year: Never true  Transportation Needs: No Transportation  Needs (02/19/2022)   Received from [facility], [facility]   PRAPARE - Transportation    Lack of Transportation (Medical): No    Lack of Transportation (Non-Medical): No  Physical Activity: Not on file  Stress: Not on file  Social Connections: Unknown (05/26/2021)   Received from [facility]   Social Network    Social Network: Not on file  Intimate Partner Violence: Unknown (04/14/2021)   Received from [facility]   HITS    Physically Hurt: Not on file    Insult or Talk Down To: Not on file    Threaten Physical Harm: Not on file    Scream or Curse: Not on file     History reviewed. No pertinent family history.   Review of Systems Pertinent items are noted in HPI.  Objective: Vital signs in last 24 hours:    05/19/2023    2:27 PM 07/13/2022    4:38 PM 03/25/2022    1:26 PM  Vitals with BMI  Height 5\' 4"  5\' 4"  5\' 4"   Weight 171 lbs 166 lbs 163 lbs  BMI 29.34 28.48 27.97  Systolic  112 116  Diastolic  60 68  Pulse  78 105      EXAM: General: Well nourished, well developed. Awake, alert and oriented to time, place, person. Normal  mood and affect. No apparent distress. Breathing room air.  Operative Lower Extremity: Alignment - Neutral Deformity - None Skin intact Tenderness to palpation - left peroneal tendons 5/5 TA, PT, GS, Per, EHL, FHL Sensation intact to light touch throughout Palpable DP and PT pulses Special testing: None  The contralateral foot/ankle was examined for comparison and noted to be neurovascularly intact with no localized deformity, swelling, or tenderness.  Imaging Review All images taken were independently reviewed by me.  Assessment/Plan: The clinical and radiographic findings were reviewed and discussed at length with the patient.  The patient has left ankle hardware and peroneal tendinopathy.  We spoke at length about the natural course of these findings. We discussed nonoperative and operative treatment options in detail.  The  risks and benefits were presented and reviewed. The risks due to inability to remove part/all of hardware, recurrent instability, hardware failure/irritation, new/persistent/recurrent infection, stiffness, nerve/vessel/tendon injury, nonunion/malunion of any fracture, wound healing issues, allograft usage, development of arthritis, failure of this surgery, possibility of external fixation in certain situations, possibility of delayed definitive surgery, need for further surgery, prolonged wound care including further soft tissue coverage procedures, thromboembolic events, anesthesia/medical complications/events perioperatively and beyond, amputation, death among others were discussed. The patient acknowledged the explanation and agreed to proceed with the plan.  Ali Ink  Orthopaedic Surgery [facility]

## 2023-05-26 ENCOUNTER — Ambulatory Visit (HOSPITAL_BASED_OUTPATIENT_CLINIC_OR_DEPARTMENT_OTHER)
Admission: RE | Admit: 2023-05-26 | Discharge: 2023-05-26 | Disposition: A | Discharge status: Home / Self Care | Attending: Orthopaedic Surgery | Admitting: Orthopaedic Surgery

## 2023-05-26 ENCOUNTER — Other Ambulatory Visit: Payer: Self-pay

## 2023-05-26 ENCOUNTER — Encounter (HOSPITAL_BASED_OUTPATIENT_CLINIC_OR_DEPARTMENT_OTHER)
Admission: RE | Disposition: A | Discharge status: Home / Self Care | Payer: Self-pay | Source: Home / Self Care | Attending: Orthopaedic Surgery

## 2023-05-26 ENCOUNTER — Ambulatory Visit (HOSPITAL_BASED_OUTPATIENT_CLINIC_OR_DEPARTMENT_OTHER)

## 2023-05-26 ENCOUNTER — Ambulatory Visit (HOSPITAL_BASED_OUTPATIENT_CLINIC_OR_DEPARTMENT_OTHER): Admitting: Anesthesiology

## 2023-05-26 ENCOUNTER — Encounter (HOSPITAL_BASED_OUTPATIENT_CLINIC_OR_DEPARTMENT_OTHER): Payer: Self-pay | Admitting: Orthopaedic Surgery

## 2023-05-26 DIAGNOSIS — M65862 Other synovitis and tenosynovitis, left lower leg: Secondary | ICD-10-CM | POA: Diagnosis present

## 2023-05-26 DIAGNOSIS — Z8781 Personal history of (healed) traumatic fracture: Secondary | ICD-10-CM | POA: Diagnosis not present

## 2023-05-26 DIAGNOSIS — S8262XD Displaced fracture of lateral malleolus of left fibula, subsequent encounter for closed fracture with routine healing: Secondary | ICD-10-CM

## 2023-05-26 DIAGNOSIS — Z01818 Encounter for other preprocedural examination: Secondary | ICD-10-CM

## 2023-05-26 HISTORY — DX: Depression, unspecified: F32.A

## 2023-05-26 HISTORY — DX: Panic disorder (episodic paroxysmal anxiety): F41.0

## 2023-05-26 SURGERY — REMOVAL, HARDWARE
Anesthesia: General | Site: Leg Lower | Laterality: Left

## 2023-05-26 MED ORDER — DEXAMETHASONE SODIUM PHOSPHATE 10 MG/ML IJ SOLN
INTRAMUSCULAR | Status: AC
  Filled 2023-05-26: qty 1

## 2023-05-26 MED ORDER — ONDANSETRON HCL 4 MG/2ML IJ SOLN
INTRAMUSCULAR | Status: DC | PRN
  Administered 2023-05-26: 4 mg via INTRAVENOUS

## 2023-05-26 MED ORDER — EPHEDRINE SULFATE-NACL 50-0.9 MG/10ML-% IV SOSY
PREFILLED_SYRINGE | INTRAVENOUS | Status: DC | PRN
  Administered 2023-05-26 (×2): 5 mg via INTRAVENOUS
  Administered 2023-05-26: 10 mg via INTRAVENOUS

## 2023-05-26 MED ORDER — MIDAZOLAM HCL 2 MG/2ML IJ SOLN
INTRAMUSCULAR | Status: DC | PRN
  Administered 2023-05-26: 2 mg via INTRAVENOUS

## 2023-05-26 MED ORDER — OXYCODONE HCL 5 MG PO TABS
ORAL_TABLET | ORAL | Status: AC
  Filled 2023-05-26: qty 1

## 2023-05-26 MED ORDER — LIDOCAINE 2% (20 MG/ML) 5 ML SYRINGE
INTRAMUSCULAR | Status: DC | PRN
  Administered 2023-05-26: 70 mg via INTRAVENOUS

## 2023-05-26 MED ORDER — LIDOCAINE 2% (20 MG/ML) 5 ML SYRINGE
INTRAMUSCULAR | Status: AC
  Filled 2023-05-26: qty 5

## 2023-05-26 MED ORDER — ACETAMINOPHEN 10 MG/ML IV SOLN
INTRAVENOUS | Status: AC
  Filled 2023-05-26: qty 100

## 2023-05-26 MED ORDER — VANCOMYCIN HCL 500 MG IV SOLR
INTRAVENOUS | Status: DC | PRN
  Administered 2023-05-26: 500 mg via TOPICAL

## 2023-05-26 MED ORDER — MIDAZOLAM HCL 2 MG/2ML IJ SOLN
INTRAMUSCULAR | Status: AC
Start: 2023-05-26 — End: ?
  Filled 2023-05-26: qty 2

## 2023-05-26 MED ORDER — CEFAZOLIN SODIUM-DEXTROSE 2-4 GM/100ML-% IV SOLN
INTRAVENOUS | Status: AC
  Filled 2023-05-26: qty 100

## 2023-05-26 MED ORDER — ACETAMINOPHEN 10 MG/ML IV SOLN
INTRAVENOUS | Status: DC | PRN
  Administered 2023-05-26: 1000 mg via INTRAVENOUS

## 2023-05-26 MED ORDER — VANCOMYCIN HCL 500 MG IV SOLR
INTRAVENOUS | Status: AC
  Filled 2023-05-26: qty 10

## 2023-05-26 MED ORDER — 0.9 % SODIUM CHLORIDE (POUR BTL) OPTIME
TOPICAL | Status: DC | PRN
  Administered 2023-05-26: 500 mL

## 2023-05-26 MED ORDER — LACTATED RINGERS IV SOLN: INTRAVENOUS | Status: DC

## 2023-05-26 MED ORDER — FENTANYL CITRATE (PF) 100 MCG/2ML IJ SOLN
INTRAMUSCULAR | Status: DC | PRN
  Administered 2023-05-26: 50 ug via INTRAVENOUS
  Administered 2023-05-26 (×2): 25 ug via INTRAVENOUS

## 2023-05-26 MED ORDER — BUPIVACAINE-EPINEPHRINE 0.5% -1:200000 IJ SOLN
INTRAMUSCULAR | Status: DC | PRN
  Administered 2023-05-26: 20 mL

## 2023-05-26 MED ORDER — DROPERIDOL 2.5 MG/ML IJ SOLN: 0.6250 mg | Freq: Once | INTRAMUSCULAR | Status: DC | PRN

## 2023-05-26 MED ORDER — ONDANSETRON HCL 4 MG/2ML IJ SOLN
INTRAMUSCULAR | Status: AC
Start: 2023-05-26 — End: ?
  Filled 2023-05-26: qty 2

## 2023-05-26 MED ORDER — FENTANYL CITRATE (PF) 100 MCG/2ML IJ SOLN
INTRAMUSCULAR | Status: AC
  Filled 2023-05-26: qty 2

## 2023-05-26 MED ORDER — OXYCODONE HCL 5 MG PO TABS
5.0000 mg | ORAL_TABLET | Freq: Once | ORAL | Status: AC | PRN
  Administered 2023-05-26: 5 mg via ORAL

## 2023-05-26 MED ORDER — CHLORHEXIDINE GLUCONATE 4 % EX SOLN: 60.0000 mL | Freq: Once | CUTANEOUS | Status: DC

## 2023-05-26 MED ORDER — CEFAZOLIN SODIUM-DEXTROSE 2-4 GM/100ML-% IV SOLN
2.0000 g | INTRAVENOUS | Status: AC
  Administered 2023-05-26: 2 g via INTRAVENOUS

## 2023-05-26 MED ORDER — PROPOFOL 10 MG/ML IV BOLUS
INTRAVENOUS | Status: AC
  Filled 2023-05-26: qty 20

## 2023-05-26 MED ORDER — BUPIVACAINE-EPINEPHRINE (PF) 0.5% -1:200000 IJ SOLN
INTRAMUSCULAR | Status: AC
  Filled 2023-05-26: qty 30

## 2023-05-26 MED ORDER — HYDROMORPHONE HCL 1 MG/ML IJ SOLN: 0.2500 mg | INTRAMUSCULAR | Status: DC | PRN

## 2023-05-26 MED ORDER — OXYCODONE HCL 5 MG/5ML PO SOLN: 5.0000 mg | Freq: Once | ORAL | Status: AC | PRN

## 2023-05-26 MED ORDER — POVIDONE-IODINE 10 % EX SOLN
CUTANEOUS | Status: DC | PRN
  Administered 2023-05-26: 1 via TOPICAL

## 2023-05-26 MED ORDER — ACETAMINOPHEN 500 MG PO TABS: 1000.0000 mg | ORAL_TABLET | Freq: Once | ORAL | Status: DC

## 2023-05-26 MED ORDER — DEXAMETHASONE SODIUM PHOSPHATE 10 MG/ML IJ SOLN
INTRAMUSCULAR | Status: DC | PRN
  Administered 2023-05-26: 8 mg via INTRAVENOUS

## 2023-05-26 MED ORDER — PROPOFOL 10 MG/ML IV BOLUS
INTRAVENOUS | Status: DC | PRN
  Administered 2023-05-26: 140 mg via INTRAVENOUS

## 2023-05-26 SURGICAL SUPPLY — 65 items
BANDAGE ESMARK 6X9 LF (GAUZE/BANDAGES/DRESSINGS) ×1 IMPLANT
BLADE SURG 15 STRL LF DISP TIS (BLADE) ×4 IMPLANT
BNDG COHESIVE 4X5 TAN STRL LF (GAUZE/BANDAGES/DRESSINGS) ×1 IMPLANT
BNDG ELASTIC 4INX 5YD STR LF (GAUZE/BANDAGES/DRESSINGS) ×1 IMPLANT
BNDG ELASTIC 6INX 5YD STR LF (GAUZE/BANDAGES/DRESSINGS) IMPLANT
BNDG ELASTIC 6X10 VLCR STRL LF (GAUZE/BANDAGES/DRESSINGS) IMPLANT
BNDG GAUZE DERMACEA FLUFF 4 (GAUZE/BANDAGES/DRESSINGS) ×1 IMPLANT
BOOT STEPPER DURA LG (SOFTGOODS) IMPLANT
BOOT STEPPER DURA MED (SOFTGOODS) IMPLANT
BOOT STEPPER DURA XLG (SOFTGOODS) IMPLANT
BRUSH SCRUB EZ 4% CHG (MISCELLANEOUS) ×1 IMPLANT
CANISTER SUCT 1200ML W/VALVE (MISCELLANEOUS) ×1 IMPLANT
CHLORAPREP W/TINT 26 (MISCELLANEOUS) ×2 IMPLANT
COVER BACK TABLE 60X90IN (DRAPES) ×1 IMPLANT
CUFF TRNQT CYL 34X4.125X (TOURNIQUET CUFF) IMPLANT
DRAPE C-ARM 42X72 X-RAY (DRAPES) IMPLANT
DRAPE C-ARMOR (DRAPES) IMPLANT
DRAPE EXTREMITY T 121X128X90 (DISPOSABLE) ×1 IMPLANT
DRAPE IMP U-DRAPE 54X76 (DRAPES) ×1 IMPLANT
DRAPE OEC MINIVIEW 54X84 (DRAPES) IMPLANT
DRAPE U-SHAPE 47X51 STRL (DRAPES) ×1 IMPLANT
DRSG MEPITEL 4X7.2 (GAUZE/BANDAGES/DRESSINGS) ×1 IMPLANT
ELECTRODE REM PT RTRN 9FT ADLT (ELECTROSURGICAL) ×1 IMPLANT
GAUZE PAD ABD 8X10 STRL (GAUZE/BANDAGES/DRESSINGS) ×1 IMPLANT
GAUZE SPONGE 4X4 12PLY STRL (GAUZE/BANDAGES/DRESSINGS) ×1 IMPLANT
GLOVE BIOGEL PI IND STRL 8 (GLOVE) ×2 IMPLANT
GLOVE SURG SS PI 7.5 STRL IVOR (GLOVE) ×2 IMPLANT
GOWN STRL REUS W/ TWL LRG LVL3 (GOWN DISPOSABLE) ×2 IMPLANT
MARKER SKIN DUAL TIP RULER LAB (MISCELLANEOUS) IMPLANT
NDL HYPO 22X1.5 SAFETY MO (MISCELLANEOUS) IMPLANT
NDL HYPO 25X1 1.5 SAFETY (NEEDLE) IMPLANT
NEEDLE HYPO 22X1.5 SAFETY MO (MISCELLANEOUS) IMPLANT
NEEDLE HYPO 25X1 1.5 SAFETY (NEEDLE) IMPLANT
NS IRRIG 1000ML POUR BTL (IV SOLUTION) ×1 IMPLANT
PACK BASIN DAY SURGERY FS (CUSTOM PROCEDURE TRAY) ×1 IMPLANT
PAD CAST 4YDX4 CTTN HI CHSV (CAST SUPPLIES) ×1 IMPLANT
PADDING CAST ABS COTTON 4X4 ST (CAST SUPPLIES) IMPLANT
PADDING CAST COTTON 6X4 STRL (CAST SUPPLIES) ×1 IMPLANT
PADDING CAST SYNTHETIC 4X4 STR (CAST SUPPLIES) IMPLANT
PENCIL SMOKE EVACUATOR (MISCELLANEOUS) ×1 IMPLANT
SANITIZER HAND PURELL FF 515ML (MISCELLANEOUS) ×1 IMPLANT
SHEET MEDIUM DRAPE 40X70 STRL (DRAPES) ×1 IMPLANT
SLEEVE SCD COMPRESS KNEE MED (STOCKING) ×1 IMPLANT
SPIKE FLUID TRANSFER (MISCELLANEOUS) IMPLANT
SPLINT FIBERGLASS 4X30 (CAST SUPPLIES) IMPLANT
SPLINT PLASTER CAST FAST 5X30 (CAST SUPPLIES) IMPLANT
SPONGE T-LAP 18X18 ~~LOC~~+RFID (SPONGE) ×1 IMPLANT
STAPLER SKIN PROX WIDE 3.9 (STAPLE) IMPLANT
STOCKINETTE 6 STRL (DRAPES) ×1 IMPLANT
STOCKINETTE ORTHO 6X25 (MISCELLANEOUS) ×1 IMPLANT
SUCTION TUBE FRAZIER 10FR DISP (SUCTIONS) IMPLANT
SUT ETHILON 2 0 FS 18 (SUTURE) ×2 IMPLANT
SUT ETHILON 2 0 PS N (SUTURE) ×1 IMPLANT
SUT MNCRL AB 3-0 PS2 18 (SUTURE) ×1 IMPLANT
SUT VIC AB 2-0 CT1 TAPERPNT 27 (SUTURE) IMPLANT
SUT VIC AB 2-0 SH 27XBRD (SUTURE) ×1 IMPLANT
SUT VIC AB 3-0 SH 27X BRD (SUTURE) IMPLANT
SUT VICRYL 0 SH 27 (SUTURE) IMPLANT
SYR BULB EAR ULCER 3OZ GRN STR (SYRINGE) ×1 IMPLANT
SYR BULB IRRIG 60ML STRL (SYRINGE) ×1 IMPLANT
SYR CONTROL 10ML LL (SYRINGE) IMPLANT
TOWEL GREEN STERILE FF (TOWEL DISPOSABLE) ×2 IMPLANT
TUBE CONNECTING 20X1/4 (TUBING) IMPLANT
UNDERPAD 30X36 HEAVY ABSORB (UNDERPADS AND DIAPERS) ×1 IMPLANT
YANKAUER SUCT BULB TIP NO VENT (SUCTIONS) IMPLANT

## 2023-05-26 NOTE — Transfer of Care (Signed)
 Immediate Anesthesia Transfer of Care Note  Patient: Janet Walsh  Procedure(s) Performed: Procedure(s) (LRB): REMOVAL, HARDWARE (Left) TENDON REPAIR (Left)  Patient Location: PACU  Anesthesia Type: General  Level of Consciousness: awake, oriented, sedated and patient cooperative  Airway & Oxygen Therapy: Patient Spontanous Breathing Room Air  Post-op Assessment: Report given to PACU RN and Post -op Vital signs reviewed and stable  Post vital signs: Reviewed and stable  Complications: No apparent anesthesia complications  Last Vitals:  Vitals Value Taken Time  BP 124/71 05/26/23 0931  Temp    Pulse 78 05/26/23 0932  Resp 20 05/26/23 0933  SpO2 99 % 05/26/23 0932  Vitals shown include unfiled device data.  Last Pain:  Vitals:   05/26/23 0730  TempSrc: Temporal  PainSc: 1       Patients Stated Pain Goal: 1 (05/26/23 0730)  Complications: No notable events documented.

## 2023-05-26 NOTE — Anesthesia Postprocedure Evaluation (Signed)
 Anesthesia Post Note  Patient: Janet Walsh  Procedure(s) Performed: REMOVAL, HARDWARE (Left: Leg Lower) TENDON DEBRIDEMENT (Left: Leg Lower)     Patient location during evaluation: PACU Anesthesia Type: General Level of consciousness: sedated and patient cooperative Pain management: pain level controlled Vital Signs Assessment: post-procedure vital signs reviewed and stable Respiratory status: spontaneous breathing Cardiovascular status: stable Anesthetic complications: no   No notable events documented.  Last Vitals:  Vitals:   05/26/23 1000 05/26/23 1017  BP:  119/67  Pulse: 71 70  Resp: 16 18  Temp:  (!) 36.2 C  SpO2: 99% 97%    Last Pain:  Vitals:   05/26/23 1017  TempSrc:   PainSc: 3                  Gorman Laughter

## 2023-05-26 NOTE — Anesthesia Procedure Notes (Signed)
 Procedure Name: LMA Insertion Date/Time: 05/26/2023 8:46 AM  Performed by: Glo Larch, CRNAPre-anesthesia Checklist: Patient identified, Emergency Drugs available, Suction available and Patient being monitored Patient Re-evaluated:Patient Re-evaluated prior to induction Oxygen Delivery Method: Circle system utilized Preoxygenation: Pre-oxygenation with 100% oxygen Induction Type: IV induction Ventilation: Mask ventilation without difficulty LMA: LMA inserted LMA Size: 4.0 Number of attempts: 1 Airway Equipment and Method: Bite block Placement Confirmation: positive ETCO2 Tube secured with: Tape Dental Injury: Teeth and Oropharynx as per pre-operative assessment

## 2023-05-26 NOTE — H&P (Signed)
 H&P Update:  -History and Physical Reviewed  -Patient has been re-examined  -No change in the plan of care  -The risks and benefits were presented and reviewed. The risks due to inability to remove part/all of hardware, recurrent instability, hardware/suture failure and/or irritation, new/persistent infection, stiffness, nerve/vessel/tendon injury or rerupture of repaired tendon, nonunion/malunion, allograft usage, wound healing issues, development of arthritis, failure of this surgery, possibility of external fixation with delayed definitive surgery, need for further surgery, thromboembolic events, anesthesia/medical complications, amputation, death among others were discussed. The patient acknowledged the explanation, agreed to proceed with the plan and a consent was signed.  Janet Walsh

## 2023-05-26 NOTE — Progress Notes (Signed)
 No tylenol until after 3pm and last oxycodone  received at 10:25am written on dc paperwork.

## 2023-05-26 NOTE — Anesthesia Preprocedure Evaluation (Signed)
 Anesthesia Evaluation  Patient identified by MRN, date of birth, ID band Patient awake    Reviewed: Allergy & Precautions, H&P , NPO status , Patient's Chart, lab work & pertinent test results  Airway Mallampati: II  TM Distance: >3 FB Neck ROM: Full    Dental  (+) Chipped, Dental Advisory Given,    Pulmonary neg pulmonary ROS   Pulmonary exam normal breath sounds clear to auscultation       Cardiovascular negative cardio ROS Normal cardiovascular exam Rhythm:Regular Rate:Normal     Neuro/Psych  Headaches PSYCHIATRIC DISORDERS Anxiety Depression       GI/Hepatic Neg liver ROS,GERD  Controlled,,  Endo/Other  negative endocrine ROS    Renal/GU negative Renal ROS     Musculoskeletal negative musculoskeletal ROS (+)    Abdominal   Peds  Hematology negative hematology ROS (+)   Anesthesia Other Findings   Reproductive/Obstetrics negative OB ROS                             Anesthesia Physical Anesthesia Plan  ASA: 2  Anesthesia Plan: General   Post-op Pain Management: Regional block*, Tylenol PO (pre-op)* and Toradol IV (intra-op)*   Induction: Intravenous  PONV Risk Score and Plan: 3 and Ondansetron , Dexamethasone , Midazolam  and Treatment may vary due to age or medical condition  Airway Management Planned: LMA  Additional Equipment:   Intra-op Plan:   Post-operative Plan: Extubation in OR  Informed Consent: I have reviewed the patients History and Physical, chart, labs and discussed the procedure including the risks, benefits and alternatives for the proposed anesthesia with the patient or authorized representative who has indicated his/her understanding and acceptance.     Dental advisory given  Plan Discussed with: CRNA  Anesthesia Plan Comments:        Anesthesia Quick Evaluation

## 2023-05-26 NOTE — Op Note (Addendum)
 05/26/2023  1:12 PM   PATIENT: Janet Walsh  58 y.o. female  MRN: [id number]   PRE-OPERATIVE DIAGNOSIS:   Closed fracture of lateral malleolus of left fibula s/p ORIF with peroneal tendinopathy   POST-OPERATIVE DIAGNOSIS:   Same   PROCEDURE: 1] Left ankle removal of lateral deep hardware 2] Left ankle removal of medial deep hardware 3] Left ankle debridement of peroneus brevis 4] Left ankle debridement of peroneus longus   SURGEON:  Ali Ink, MD   ASSISTANT: None   ANESTHESIA: General, regional   EBL: Minimal   TOURNIQUET:    Total Tourniquet Time Documented: Thigh (Left) - 27 minutes Total: Thigh (Left) - 27 minutes    COMPLICATIONS: None apparent   DISPOSITION: Extubated, awake and stable to recovery.   INDICATION FOR PROCEDURE: The patient presented with above diagnosis.  We discussed the diagnosis, alternative treatment options, risks and benefits of the above surgical intervention, as well as alternative non-operative treatments. All questions/concerns were addressed and the patient/family demonstrated appropriate understanding of the diagnosis, the procedure, the postoperative course, and overall prognosis. The patient wished to proceed with surgical intervention and signed an informed surgical consent as such, in each others presence prior to surgery.   PROCEDURE IN DETAIL: After preoperative consent was obtained and the correct operative site was identified, the patient was brought to the operating room supine on stretcher and transferred onto operating table. General anesthesia was induced. Preoperative antibiotics were administered. Surgical timeout was taken. The patient was then positioned supine with an ipsilateral hip bump. The operative lower extremity was prepped and draped in standard sterile fashion with a tourniquet around the thigh. The extremity was exsanguinated and the tourniquet was inflated to 275 mmHg.  Prior standard  lateral incision was reused over the distal fibula. Dissection was carried down to the level of the plate and the screw heads identified. The superficial peroneal nerve was identified and protected throughout the procedure. The screws and plate were removed completely. The crossing lag screw was maintained in bone.   A separate medial incision was made with dissection carried down to the level of bone. The medial anchor of the dynamic syndesmosis fixation and associated sutures were identified and explanted thru this separate approach.  A manual external rotation stress radiograph was obtained after hardware removal and demonstrated symmetry of the ankle mortise with complete stability to testing.  We then entered the peroneal tendon sheath to evaluate the peroneus brevis and longus directly. Extensive tenosynovitis was noted and debrided; however, no tears identified. The sheath was repaired.  The surgical sites were thoroughly irrigated. The tourniquet was deflated and hemostasis achieved. Betadine  and vancomycin  powder were applied. The deep layers were closed using 2-0 vicryl. The skin was closed without tension.    The leg was cleaned with saline and sterile dressings with gauze were applied. A well padded bulky wrap was applied. The patient was awakened from anesthesia and transported to the recovery room in stable condition.    FOLLOW UP PLAN: -transfer to PACU, then home -WBAT LLE, maximum elevation -maintain dressings until follow up -DVT ppx: Aspirin  81 mg twice daily while NWB -follow up as outpatient within 7-10 days for wound check -sutures out in 2-3 weeks in outpatient office   RADIOGRAPHS: AP, lateral, oblique and stress radiographs of the left ankle were obtained intraoperatively. These showed interval removal of hardware. Manual stress radiographs were taken and the joints were noted to be stable following hardware removal. All remaining hardware is appropriately  positioned  and of the appropriate lengths. No other acute injuries are noted.   Ali Ink Orthopaedic Surgery [facility]

## 2023-05-27 ENCOUNTER — Encounter (HOSPITAL_BASED_OUTPATIENT_CLINIC_OR_DEPARTMENT_OTHER): Payer: Self-pay | Admitting: Orthopaedic Surgery

## 2023-06-28 ENCOUNTER — Emergency Department (HOSPITAL_COMMUNITY)

## 2023-06-28 ENCOUNTER — Other Ambulatory Visit: Payer: Self-pay

## 2023-06-28 ENCOUNTER — Emergency Department (HOSPITAL_COMMUNITY)
Admission: EM | Admit: 2023-06-28 | Discharge: 2023-06-28 | Disposition: A | Discharge status: Home / Self Care | Attending: Emergency Medicine | Admitting: Emergency Medicine

## 2023-06-28 DIAGNOSIS — R0789 Other chest pain: Secondary | ICD-10-CM | POA: Diagnosis not present

## 2023-06-28 DIAGNOSIS — R079 Chest pain, unspecified: Secondary | ICD-10-CM | POA: Diagnosis present

## 2023-06-28 DIAGNOSIS — R739 Hyperglycemia, unspecified: Secondary | ICD-10-CM | POA: Diagnosis not present

## 2023-06-28 LAB — CBC
HCT: 42.2 % (ref 36.0–46.0)
Hemoglobin: 13.3 g/dL (ref 12.0–15.0)
MCH: 30.5 pg (ref 26.0–34.0)
MCHC: 31.5 g/dL (ref 30.0–36.0)
MCV: 96.8 fL (ref 80.0–100.0)
Platelets: 364 10*3/uL (ref 150–400)
RBC: 4.36 MIL/uL (ref 3.87–5.11)
RDW: 13 % (ref 11.5–15.5)
WBC: 6.6 10*3/uL (ref 4.0–10.5)
nRBC: 0 % (ref 0.0–0.2)

## 2023-06-28 LAB — BASIC METABOLIC PANEL WITH GFR
Anion gap: 11 (ref 5–15)
BUN: 10 mg/dL (ref 6–20)
CO2: 22 mmol/L (ref 22–32)
Calcium: 9.6 mg/dL (ref 8.9–10.3)
Chloride: 104 mmol/L (ref 98–111)
Creatinine, Ser: 0.83 mg/dL (ref 0.44–1.00)
GFR, Estimated: >60 mL/min (ref >60)
Glucose, Bld: 169 mg/dL — ABNORMAL HIGH (ref 70–99)
Potassium: 3.5 mmol/L (ref 3.5–5.1)
Sodium: 137 mmol/L (ref 135–145)

## 2023-06-28 LAB — D-DIMER, QUANTITATIVE: D-Dimer, Quant: <0.27 ug{FEU}/mL (ref 0.00–0.50)

## 2023-06-28 LAB — TROPONIN I (HIGH SENSITIVITY)
Troponin I (High Sensitivity): <2 ng/L (ref <18)
Troponin I (High Sensitivity): <2 ng/L (ref <18)

## 2023-06-28 MED ORDER — ALUM & MAG HYDROXIDE-SIMETH 200-200-20 MG/5ML PO SUSP
30.0000 mL | Freq: Once | ORAL | Status: AC
  Filled 2023-06-28 (×2): qty 30

## 2023-06-28 NOTE — ED Provider Notes (Signed)
 [facility] Provider Note   CSN: [id number] Arrival date & time: 06/28/23  6213     Patient presents with: Panic Attack, Anxiety, Nausea, Back Pain, and Chest Pain   Janet Walsh is a 58 y.o. female with PMHx depression, anxiety, migraines, vertigo, GERD who presents to ED concerned for possible panic attack. Patient endorsing chest pain and pins/needles sensation starting last night and was not able to sleep d/t anxiety about the symptoms. Chest pain lasted a couple of min and happened while patient was resting. Patient then went to work this morning and continued to have paresthesias and another episode of chest pain so she came to ED. Patient stating that she just wants to make sure that this is truly a panic attack rather than another emergent process. Patient also endorsing a tension headache that she believes is from not eating any food recently. Patient also endorsing tachycardia while at work ranging from 80-120's.  Patient endorsing intermittent episodes of chest pain over the years that are not associated with rest vs exertion vs food intake. Patient stating that she is supposed to be taking GERD medications but does not do this. Patient did not try any medications last night when symptoms happened.   Patient follows with outpatient cardiologist.  Patient endorsing ankle surgery 3 weeks ago to remove a metal plate. Denies fever, dyspnea, cough, nausea, vomiting, diarrhea. Denies hx DVT/PE, hemoptysis, hx cancer in the past 6 months, calf swelling/tenderness.      Anxiety Associated symptoms include chest pain.  Back Pain Associated symptoms: chest pain   Chest Pain Associated symptoms: anxiety        Prior to Admission medications   Medication Sig Start Date End Date Taking? Authorizing Provider  sertraline (ZOLOFT) 100 MG tablet Take 0.5 tablets by mouth daily. Patient not taking: Reported on 05/19/2023 03/23/22    [provider]    Allergies: Tape    Review of Systems  Cardiovascular:  Positive for chest pain.    Updated Vital Signs BP 136/72   Pulse 85   Temp 98.5 F (36.9 C) (Oral)   Resp 16   Ht 5' 4 (1.626 m)   Wt 75.3 kg   LMP 01/18/2016   SpO2 100%   BMI 28.49 kg/m   Physical Exam Vitals and nursing note reviewed.  Constitutional:      General: She is not in acute distress.    Appearance: She is not ill-appearing or toxic-appearing.  HENT:     Head: Normocephalic and atraumatic.     Mouth/Throat:     Mouth: Mucous membranes are moist.     Pharynx: No oropharyngeal exudate or posterior oropharyngeal erythema.   Eyes:     General: No scleral icterus.       Right eye: No discharge.        Left eye: No discharge.     Conjunctiva/sclera: Conjunctivae normal.    Cardiovascular:     Rate and Rhythm: Normal rate and regular rhythm.     Pulses: Normal pulses.     Heart sounds: Normal heart sounds. No murmur heard. Pulmonary:     Effort: Pulmonary effort is normal. No respiratory distress.     Breath sounds: Normal breath sounds. No wheezing, rhonchi or rales.  Abdominal:     Tenderness: There is no abdominal tenderness.     Comments: No calf tenderness to palpation   Musculoskeletal:     Right lower leg: No edema.  Left lower leg: No edema.   Skin:    General: Skin is warm and dry.     Findings: No rash.   Neurological:     General: No focal deficit present.     Mental Status: She is alert and oriented to person, place, and time. Mental status is at baseline.     Comments: Sensation to light touch intact.  Psychiatric:        Mood and Affect: Mood normal.        Behavior: Behavior normal.     (all labs ordered are listed, but only abnormal results are displayed) Labs Reviewed  BASIC METABOLIC PANEL WITH GFR - Abnormal; Notable for the following components:      Result Value   Glucose, Bld 169 (*)    All other components within normal limits   CBC  D-DIMER, QUANTITATIVE  TROPONIN I (HIGH SENSITIVITY)  TROPONIN I (HIGH SENSITIVITY)    EKG: None  Radiology: DG Chest 2 View Result Date: 06/28/2023 CLINICAL DATA:  Chest pain EXAM: CHEST - 2 VIEW COMPARISON:  March 11, 2021 FINDINGS: The heart size and mediastinal contours are within normal limits. Both lungs are clear. The visualized skeletal structures are unremarkable. IMPRESSION: No active cardiopulmonary disease. Electronically Signed   By: Fredrich Jefferson M.D.   On: 06/28/2023 10:06     Procedures   Medications Ordered in the ED  alum & mag hydroxide-simeth (MAALOX/MYLANTA) 200-200-20 MG/5ML suspension 30 mL (30 mLs Oral Given 06/28/23 1202)                                    Medical Decision Making Amount and/or Complexity of Data Reviewed Labs: ordered. Radiology: ordered.  Risk OTC drugs.   This patient presents to the ED for concern of chest pain, this involves an extensive number of treatment options, and is a complaint that carries with it a high risk of complications and morbidity.  The differential diagnosis includes acute coronary syndrome, congestive heart failure, pericarditis, pneumonia, pulmonary embolism, tension pneumothorax, esophageal rupture, aortic dissection, cardiac tamponade, musculoskeletal   Co morbidities that complicate the patient evaluation  depression, anxiety, migraines, vertigo, GERD   Additional history obtained:  04/2022 ECHO: 60-65% EF. No significant valvular disease.   Problem List / ED Course / Critical interventions / Medication management  Patient presented for intermittent episodes of chest pain and paresthesias since last night.  Physical exam reassuring.  Patient afebrile with stable vitals. HEART score: 1.  I Ordered, and personally interpreted labs.  Initial and repeat troponin within normal limits.  CBC without leukocytosis or anemia.  BMP with hyperglycemia 169.  D-dimer within normal limits. The patient was  maintained on a cardiac monitor.  I personally viewed and interpreted the EKG/cardiac monitored which showed an underlying rhythm of: Sinus tachycardia. I ordered imaging studies including chest xray to assess for process contributing to patient's symptoms. I independently visualized and interpreted imaging which showed no acute cardiopulmonary disease. I agree with the radiologist interpretation Shared results with patient.  Answered all questions.  Patient stated that she is feeling good.  Patient stating that she will be calling her cardiologist for a follow-up appointment. I have reviewed the patients home medicines and have made adjustments as needed The patient has been appropriately medically screened and/or stabilized in the ED. I have low suspicion for any other emergent medical condition which would require further screening, evaluation or  treatment in the ED or require inpatient management. At time of discharge the patient is hemodynamically stable and in no acute distress. I have discussed work-up results and diagnosis with patient and answered all questions. Patient is agreeable with discharge plan. We discussed strict return precautions for returning to the emergency department and they verbalized understanding.     Social Determinants of Health:  none      Final diagnoses:  Nonspecific chest pain    ED Discharge Orders     None          [place], [place] 06/28/23 1352    Mozell Arias, MD 06/28/23 1505

## 2023-06-28 NOTE — ED Notes (Signed)
 Pt in bed, pt denies pain, pt states that she is ready to go home, pt verbalized understanding d/c instructions and follow up, advised to return for any concerns or worsening symptoms. Pt ambulatory from department with friend.

## 2023-06-28 NOTE — Discharge Instructions (Signed)
 It was a pleasure caring for you today.  Please follow-up with your primary care provider and your outpatient cardiologist.  Seek emergency care if experiencing any new or worsening symptoms.

## 2023-06-28 NOTE — ED Triage Notes (Addendum)
 Pt. Stated, I started feeling weird last night like I was hurting in my chest and my legs felt weird. I had some chest pain . I didn't go to slepp cause I was afraid. I went to work and I was feeling the same like I was having a full blown panic attack. My boss even ask me was I ok and she said I look pale.Janet Walsh also having tingling sometimes in my hands. Ive had panic attacks and want to make sure that's all it is.

## 2023-07-10 NOTE — Progress Notes (Unsigned)
 Cardiology Office Note    Date:  07/12/2023  ID:  Janet Walsh, DOB 02-17-1965, MRN [id number] PCP:  Rena Luke POUR, MD  Cardiologist:  Stanly DELENA Leavens, MD  Electrophysiologist:  None   Chief Complaint: ER f/u chest pain  History of Present Illness: .    Janet Walsh is a 58 y.o. female (originally from [place] -> [place] -> [place]) with visit-pertinent history of GERD, panic attacks, migraines, vertigo, hepatic steatosis by CT seen for ER follow-up. She has been previously seen for noncardiac chest pain. 2014 stress echo was normal. Monitor 2016 was normal. ETT 2019 was normal. Calcium score 2022 was zero, trivial posterior pericardial effusion, small area in mRCA which may represent calcification is under detection threshold, hepatic steatosis. Last echo 04/2022 EF 60-65%, trivial MR, no pericardial effusion. Recently seen in ED 06/28/23 for chest pain with negative trops/d-dimer. EKG had shown sinus tachycardia at low level with nonspecific STTW changes.  She returns for follow-up for evaluation of the above. She shares her longstanding history of chest pain, with increasing episodes recently prompting ED visit. She reports this happens every other day, lasting minutes to hours at a time. It typically starts with a sharp pain under her left chest wall, then spirals into other symptoms including left arm tingling, headache, flushing. She often becomes anxious thinking about why these symptoms are happening and has to calm herself down. She shares a history of anxieties about other somatic symptoms as well. Clenching her body will sometimes abate the symptoms. They are not clearly associated with exertion or inspiration. She sometimes checks her smartwatch and will see a HR in the 120s with episodes, but not every time. Treatment for GERD previously did not change symptoms.   Labwork independently reviewed: 06/2023 trops, d-dimer, CBC wnl, glu 169, Cr 0.83 2022 TSH wnl  ROS: .     Please see the history of present illness.  All other systems are reviewed and otherwise negative.  Studies Reviewed: Janet Walsh    EKG:  EKG is not ordered today but personally reviewed ED EKG with sinus tach 101bpm nonspecific STTW changes  CV Studies: Cardiac studies reviewed are outlined and summarized above. Otherwise please see EMR for full report.   Current Reported Medications:.    Current Meds  Medication Sig   sertraline (ZOLOFT) 100 MG tablet Take 0.5 tablets by mouth daily.    Physical Exam:    VS:  BP 114/76 (BP Location: Left Arm, Patient Position: Sitting, Cuff Size: Large)   Pulse 70   Ht 5' 4 (1.626 m)   Wt 165 lb 3.2 oz (74.9 kg)   LMP 01/18/2016   SpO2 96%   BMI 28.36 kg/m    Wt Readings from Last 3 Encounters:  07/12/23 165 lb 3.2 oz (74.9 kg)  06/28/23 166 lb (75.3 kg)  05/26/23 164 lb 7.4 oz (74.6 kg)    GEN: Well nourished, well developed in no acute distress NECK: No JVD; No carotid bruits CARDIAC: RRR, no murmurs, rubs, gallops RESPIRATORY:  Clear to auscultation without rales, wheezing or rhonchi  ABDOMEN: Soft, non-tender, non-distended EXTREMITIES:  No edema; No acute deformity   Asessement and Plan:.    1. Chest pain of uncertain etiology, nonspecific EKG changes similar to prior - historically reassuring workup. ED eval showed negative troponins and negative d-dimer. Will pursue formal coronary CTA for evaluation as well as 14 day Zio to exclude paroxysmal arrhythmia. If these are reassuring, suspect anxiety continues to play a  role. Given the constellation of issues will also get 5HIAA to exclude carcinoid, TSH, ESR, CRP. No current indication to repeat echo which was reassuring for similar symptoms last year.  2. Tachycardia - sinus tachycardia in ED recently, will obtain Zio for completeness to exclude a paroxysmal SVT contributing to presentation. HR 70s today.  3. Hyperglycemia - noted by ER labs. Will obtain A1c for completeness.     Disposition: F/u with me in 4 months.  Signed, Hodges Treiber N Mekhi Lascola, PA-C

## 2023-07-12 ENCOUNTER — Encounter: Payer: Self-pay | Admitting: Physician Assistant

## 2023-07-12 ENCOUNTER — Ambulatory Visit

## 2023-07-12 ENCOUNTER — Ambulatory Visit: Attending: Physician Assistant | Admitting: Physician Assistant

## 2023-07-12 VITALS — BP 114/76 | HR 70 | Ht 64.0 in | Wt 165.2 lb

## 2023-07-12 DIAGNOSIS — R000 Tachycardia, unspecified: Secondary | ICD-10-CM | POA: Diagnosis not present

## 2023-07-12 DIAGNOSIS — R232 Flushing: Secondary | ICD-10-CM | POA: Diagnosis not present

## 2023-07-12 DIAGNOSIS — R739 Hyperglycemia, unspecified: Secondary | ICD-10-CM | POA: Diagnosis not present

## 2023-07-12 DIAGNOSIS — R079 Chest pain, unspecified: Secondary | ICD-10-CM

## 2023-07-12 LAB — HEMOGLOBIN A1C
Est. average glucose Bld gHb Est-mCnc: 134 mg/dL
Hgb A1c MFr Bld: 6.3 % — ABNORMAL HIGH (ref 4.8–5.6)

## 2023-07-12 MED ORDER — METOPROLOL TARTRATE 100 MG PO TABS: 100.0000 mg | ORAL_TABLET | Freq: Once | ORAL | 0 refills | Status: DC

## 2023-07-12 NOTE — Progress Notes (Unsigned)
Applied a 14 day Zio XT monitor to be mailed to patients home  Chandrasekhar to read

## 2023-07-12 NOTE — Patient Instructions (Addendum)
 Medication Instructions:  NO CHANGES *If you need a refill on your cardiac medications before your next appointment, please call your pharmacy*  Lab Work: HEMOGLOBIN A1C,TSH,CRP,SEDIMENTATION RATE AND 5 HIAA TODAY If you have labs (blood work) drawn today and your tests are completely normal, you will receive your results only by: MyChart Message (if you have MyChart) OR A paper copy in the mail If you have any lab test that is abnormal or we need to change your treatment, we will call you to review the results.  Testing/Procedures: [facility]- Long Term Monitor Instructions  Your physician has requested you wear a ZIO patch monitor for 14 days.  This is a single patch monitor. [employer] supplies one patch monitor per enrollment. Additional stickers are not available. Please do not apply patch if you will be having a Nuclear Stress Test,  Echocardiogram, Cardiac CT, MRI, or Chest Xray during the period you would be wearing the  monitor. The patch cannot be worn during these tests. You cannot remove and re-apply the  ZIO XT patch monitor.  Your ZIO patch monitor will be mailed 3 day USPS to your address on file. It may take 3-5 days  to receive your monitor after you have been enrolled.  Once you have received your monitor, please review the enclosed instructions. Your monitor  has already been registered assigning a specific monitor serial # to you.  Billing and Patient Assistance Program Information  We have supplied [employer] with any of your insurance information on file for billing purposes. [employer] offers a sliding scale Patient Assistance Program for patients that do not have  insurance, or whose insurance does not completely cover the cost of the ZIO monitor.  You must apply for the Patient Assistance Program to qualify for this discounted rate.  To apply, please call [employer] at [phone], select option 4, select option 2, ask to apply for  Patient Assistance Program. [name] will  ask your household income, and how many people  are in your household. They will quote your out-of-pocket cost based on that information.  [employer] will also be able to set up a 46-month, interest-free payment plan if needed.  Applying the monitor   Shave hair from upper left chest.  Hold abrader disc by orange tab. Rub abrader in 40 strokes over the upper left chest as  indicated in your monitor instructions.  Clean area with 4 enclosed alcohol pads. Let dry.  Apply patch as indicated in monitor instructions. Patch will be placed under collarbone on left  side of chest with arrow pointing upward.  Rub patch adhesive wings for 2 minutes. Remove white label marked 1. Remove the white  label marked 2. Rub patch adhesive wings for 2 additional minutes.  While looking in a mirror, press and release button in Walsh of patch. A small green light will  flash 3-4 times. This will be your only indicator that the monitor has been turned on.  Do not shower for the first 24 hours. You may shower after the first 24 hours.  Press the button if you feel a symptom. You will hear a small click. Record Date, Time and  Symptom in the Patient Logbook.  When you are ready to remove the patch, follow instructions on the last 2 pages of Patient  Logbook. Stick patch monitor onto the last page of Patient Logbook.  Place Patient Logbook in the blue and white box. Use locking tab on box and tape box closed  securely. The blue  and white box has prepaid postage on it. Please place it in the mailbox as  soon as possible. Your physician should have your test results approximately 7 days after the  monitor has been mailed back to [facility].  Call [facility] Customer Care at [phone] if you have questions regarding  your ZIO XT patch monitor. Call them immediately if you see an orange light blinking on your  monitor.  If your monitor falls off in less than 4 days, contact our Monitor department at  [phone].  If your monitor becomes loose or falls off after 4 days call [employer] at [phone] for  suggestions on securing your monitor      Your cardiac CT will be scheduled at one of the below locations:   [name] [address]  [name], [name]    If scheduled at the [facility] at [address], please enter the parking lot using the [address] entrance and use the FREE valet service at the patient drop-off area. Enter the buidling and check-in with registration on the main floor.  Please follow these instructions carefully (unless otherwise directed):  An IV will be required for this test and Nitroglycerin will be given.   On the Night Before the Test: Be sure to Drink plenty of water. Do not consume any caffeinated/decaffeinated beverages or chocolate 12 hours prior to your test. Do not take any antihistamines 12 hours prior to your test.  On the Day of the Test: Drink plenty of water until 1 hour prior to the test. Do not eat any food 1 hour prior to test. You may take your regular medications prior to the test.  FEMALES- please wear underwire-free bra if available, avoid dresses & tight clothing  TAKE METOPROLOL TARTRATE 100 MG ONE TIME DOSE 2 HOURS PRIOR TO PROCEDURE      After the Test: Drink plenty of water. After receiving IV contrast, you may experience a mild flushed feeling. This is normal. On occasion, you may experience a mild rash up to 24 hours after the test. This is not dangerous. If this occurs, you can take Benadryl 25 mg, Zyrtec, Claritin, or Allegra and increase your fluid intake. (Patients taking Tikosyn should avoid Benadryl, and may take Zyrtec, Claritin, or Allegra) If you experience trouble breathing, this can be serious. If it is severe call 911 IMMEDIATELY. If it is mild, please call our office.  We will call to schedule your test 2-4 weeks out understanding that some  insurance companies will need an authorization prior to the service being performed.   For more information and frequently asked questions, please visit our website : [email or web address]  For non-scheduling related questions, please contact the cardiac imaging nurse navigator should you have any questions/concerns: Cardiac Imaging Nurse Navigators Direct Office Dial: [phone]   For scheduling needs, including cancellations and rescheduling, please call [place], [phone].   Follow-Up: At [facility], you and your health needs are our priority.  As part of our continuing mission to provide you with exceptional heart care, our providers are all part of one team.  This team includes your primary Cardiologist (physician) and Advanced Practice Providers or APPs (Physician Assistants and Nurse Practitioners) who all work together to provide you with the care you need, when you need it.  Your next appointment:   4 month(s)  Provider:   Dayna Dunn, PA-C

## 2023-07-13 ENCOUNTER — Ambulatory Visit: Payer: Self-pay | Admitting: Physician Assistant

## 2023-07-13 LAB — C-REACTIVE PROTEIN: CRP: 7 mg/L (ref 0–10)

## 2023-07-13 LAB — TSH: TSH: 1.59 u[IU]/mL (ref 0.450–4.500)

## 2023-07-13 LAB — SEDIMENTATION RATE: Sed Rate: 36 mm/h (ref 0–40)

## 2023-07-25 LAB — 5 HIAA, QUANTITATIVE, URINE, 24 HOUR
5-HIAA, Ur: 5.1 mg/L
5-HIAA,Quant.,24 Hr Urine: 2.9 mg/(24.h) (ref 0.0–14.9)

## 2023-07-28 ENCOUNTER — Encounter (HOSPITAL_COMMUNITY): Payer: Self-pay

## 2023-07-30 ENCOUNTER — Ambulatory Visit (HOSPITAL_COMMUNITY)
Admission: RE | Admit: 2023-07-30 | Discharge: 2023-07-30 | Disposition: A | Discharge status: Home / Self Care | Source: Ambulatory Visit | Attending: Physician Assistant | Admitting: Physician Assistant

## 2023-07-30 DIAGNOSIS — R079 Chest pain, unspecified: Secondary | ICD-10-CM | POA: Insufficient documentation

## 2023-07-30 DIAGNOSIS — K760 Fatty (change of) liver, not elsewhere classified: Secondary | ICD-10-CM | POA: Diagnosis not present

## 2023-07-30 DIAGNOSIS — R232 Flushing: Secondary | ICD-10-CM | POA: Diagnosis not present

## 2023-07-30 MED ORDER — NITROGLYCERIN 0.4 MG SL SUBL
0.8000 mg | SUBLINGUAL_TABLET | Freq: Once | SUBLINGUAL | Status: AC
  Administered 2023-07-30: 0.8 mg via SUBLINGUAL

## 2023-07-30 MED ORDER — IOHEXOL 350 MG/ML SOLN
100.0000 mL | Freq: Once | INTRAVENOUS | Status: AC | PRN
  Administered 2023-07-30: 100 mL via INTRAVENOUS

## 2023-08-15 ENCOUNTER — Encounter (HOSPITAL_COMMUNITY): Payer: Self-pay

## 2023-08-15 ENCOUNTER — Ambulatory Visit (HOSPITAL_COMMUNITY)
Admission: RE | Admit: 2023-08-15 | Discharge: 2023-08-15 | Disposition: A | Discharge status: Home / Self Care | Source: Ambulatory Visit | Attending: Internal Medicine

## 2023-08-15 VITALS — BP 130/78 | HR 69 | Temp 98.6°F | Resp 16

## 2023-08-15 DIAGNOSIS — H8113 Benign paroxysmal vertigo, bilateral: Secondary | ICD-10-CM | POA: Diagnosis not present

## 2023-08-15 MED ORDER — MECLIZINE HCL 25 MG PO TABS: 25.0000 mg | ORAL_TABLET | Freq: Three times a day (TID) | ORAL | 0 refills | Status: DC | PRN

## 2023-08-15 NOTE — Discharge Instructions (Addendum)
 You have been evaluated today for dizziness. Your evaluation suggests that your symptoms are most likely due to vertigo.  You have been prescribed meclizine to help relieve your symptoms. This medicine can make you sleepy, so do not take this and drive, drink alcohol, or go to work. Please take your prescription as directed.   Please follow up with your primary care provider for further management. Drink plenty of water to stay well hydrated.  If your symptoms do not improve in the next 2-3 days with interventions, please return. Return immediately for worsening or uncontrolled symptoms, worsening headache, chest pain, shortness of breath, persistent vomiting, vision changes, fainting, or for any other concerning symptoms.  I hope you feel better!

## 2023-08-15 NOTE — ED Triage Notes (Signed)
 Patient here today with c/o dizziness and nausea since Friday night. Patient has a h/o vertigo but it has been a while since the last episode.

## 2023-08-15 NOTE — ED Provider Notes (Signed)
 [facility]    CSN: [id number] Arrival date & time: 08/15/23  1341      History   Chief Complaint Chief Complaint  Patient presents with   Dizziness    I've been getting  the feeling of vertigo for the pass 2 days - Entered by patient    HPI Janet Walsh is a 58 y.o. female.   Patient presents to urgent care for evaluation of room spinning sensation/lightheadedness that started 2 days ago.  She woke up 2 days ago with room spinning sensation and feeling unsteady with position changes.symptoms are elicited with neck movement and position changes/leaning forward.  She has been under a lot of stress recently with sleep interruptions.  History of vertigo, states this feels similar.  She has not been affected by vertigo in many years but used to take Antivert  with relief.  She has not attempted use of Antivert  prior to arrival.  Denies unilateral extremity weakness, headache, chest pain, shortness of breath, fever, chills, cough, congestion, and ear pain.  No tinnitus.  No recent falls or head injuries.  Denies urinary symptoms, nausea, vomiting, diarrhea, and abdominal pain.  States she drinks plenty of water.  She has not attempted use of any over-the-counter medications to help with symptoms PTA.   Dizziness   Past Medical History:  Diagnosis Date   Depression    Migraines    Panic attack    Panic attacks    Vertigo     Patient Active Problem List   Diagnosis Date Noted   Anxiety 07/13/2022   Depression 07/13/2022   Gastroesophageal reflux disease without esophagitis 07/13/2022    Past Surgical History:  Procedure Laterality Date   CESAREAN SECTION  1998   DILATION AND CURETTAGE OF UTERUS     HARDWARE REMOVAL Left 05/26/2023   Procedure: REMOVAL, HARDWARE;  Surgeon: Barton Drape, MD;  Location: [facility];  Service: Orthopedics;  Laterality: Left;   ORIF ANKLE FRACTURE Left 03/04/2022   Procedure: Left ankle lateral malleolus ORIF,  possible syndesmosis and/or deltoid fixation, possible allograft;  Surgeon: Barton Drape, MD;  Location: [facility];  Service: Orthopedics;  Laterality: Left;   TENDON REPAIR Left 05/26/2023   Procedure: TENDON DEBRIDEMENT;  Surgeon: Barton Drape, MD;  Location: [facility];  Service: Orthopedics;  Laterality: Left;  possible peroneal tendon debridement with possible repair   UTERINE FIBROID SURGERY      OB History   No obstetric history on file.      Home Medications    Prior to Admission medications   Medication Sig Start Date End Date Taking? Authorizing Provider  meclizine  (ANTIVERT ) 25 MG tablet Take 1 tablet (25 mg total) by mouth 3 (three) times daily as needed for dizziness. 08/15/23  Yes Enedelia Dorna HERO, FNP  valACYclovir (VALTREX) 500 MG tablet Take 500 mg by mouth as needed. 07/20/23  Yes [provider]  sertraline (ZOLOFT) 100 MG tablet Take 0.5 tablets by mouth daily. 03/23/22   [provider]    Family History Family History  Problem Relation Age of Onset   Hypertension Mother    Hyperlipidemia Mother    High blood pressure Sister    Heart disease Maternal Uncle     Social History Social History   Tobacco Use   Smoking status: Never   Smokeless tobacco: Never  Vaping Use   Vaping status: Never Used  Substance Use Topics   Alcohol use: Yes    Comment:  occasionally   Drug use: No     Allergies   Nyquil cough dm + congestion [phenylephrine -doxylamine-dm] and Tape   Review of Systems Review of Systems  Neurological:  Positive for dizziness.  Per HPI   Physical Exam Triage Vital Signs ED Triage Vitals  Encounter Vitals Group     BP 08/15/23 1414 130/78     Girls Systolic BP Percentile --      Girls Diastolic BP Percentile --      Boys Systolic BP Percentile --      Boys Diastolic BP Percentile --      Pulse Rate 08/15/23 1414 69     Resp 08/15/23 1414 16     Temp 08/15/23 1414 98.6  F (37 C)     Temp Source 08/15/23 1414 Oral     SpO2 08/15/23 1414 98 %     Weight --      Height --      Head Circumference --      Peak Flow --      Pain Score 08/15/23 1410 0     Pain Loc --      Pain Education --      Exclude from Growth Chart --    Orthostatic VS for the past 24 hrs:  BP- Lying Pulse- Lying BP- Sitting Pulse- Sitting BP- Standing at 0 minutes Pulse- Standing at 0 minutes  08/15/23 1416 131/69 74 139/74 87 140/78 82    Updated Vital Signs BP 130/78 (BP Location: Left Arm)   Pulse 69   Temp 98.6 F (37 C) (Oral)   Resp 16   LMP 01/18/2016   SpO2 98%   Visual Acuity Right Eye Distance:   Left Eye Distance:   Bilateral Distance:    Right Eye Near:   Left Eye Near:    Bilateral Near:     Physical Exam Vitals and nursing note reviewed.  Constitutional:      Appearance: She is not ill-appearing or toxic-appearing.  HENT:     Head: Normocephalic and atraumatic.     Right Ear: Hearing, tympanic membrane, ear canal and external ear normal.     Left Ear: Hearing, tympanic membrane, ear canal and external ear normal.     Nose: Nose normal.     Mouth/Throat:     Lips: Pink.     Mouth: Mucous membranes are moist. No injury or oral lesions.     Dentition: Normal dentition.     Tongue: No lesions.     Pharynx: Oropharynx is clear. Uvula midline. No pharyngeal swelling, oropharyngeal exudate, posterior oropharyngeal erythema, uvula swelling or postnasal drip.     Tonsils: No tonsillar exudate.  Eyes:     General: Lids are normal. Vision grossly intact. Gaze aligned appropriately.     Extraocular Movements: Extraocular movements intact.     Conjunctiva/sclera: Conjunctivae normal.  Neck:     Trachea: Trachea and phonation normal.  Cardiovascular:     Rate and Rhythm: Normal rate and regular rhythm.     Heart sounds: Normal heart sounds, S1 normal and S2 normal.  Pulmonary:     Effort: Pulmonary effort is normal. No respiratory distress.     Breath  sounds: Normal breath sounds and air entry.  Musculoskeletal:     Cervical back: Neck supple.  Lymphadenopathy:     Cervical: No cervical adenopathy.  Skin:    General: Skin is warm and dry.     Capillary Refill: Capillary refill takes less than 2  seconds.     Findings: No rash.  Neurological:     General: No focal deficit present.     Mental Status: She is alert and oriented to person, place, and time. Mental status is at baseline.     GCS: GCS eye subscore is 4. GCS verbal subscore is 5. GCS motor subscore is 6.     Cranial Nerves: Cranial nerves 2-12 are intact. No dysarthria or facial asymmetry.     Sensory: Sensation is intact.     Motor: Motor function is intact. No weakness, tremor, abnormal muscle tone or pronator drift.     Coordination: Coordination is intact. Romberg sign negative. Coordination normal. Finger-Nose-Finger Test normal.     Gait: Gait is intact.     Comments: Strength and sensation intact to bilateral upper and lower extremities (5/5). Moves all 4 extremities with normal coordination voluntarily. Non-focal neuro exam.   Psychiatric:        Mood and Affect: Mood normal.        Speech: Speech normal.        Behavior: Behavior normal.        Thought Content: Thought content normal.        Judgment: Judgment normal.      UC Treatments / Results  Labs (all labs ordered are listed, but only abnormal results are displayed) Labs Reviewed - No data to display  EKG   Radiology No results found.  Procedures Procedures (including critical care time)  Medications Ordered in UC Medications - No data to display  Initial Impression / Assessment and Plan / UC Course  I have reviewed the triage vital signs and the nursing notes.  Pertinent labs & imaging results that were available during my care of the patient were reviewed by me and considered in my medical decision making (see chart for details).   1.  Benign paroxysmal positional vertigo due to bilateral  vestibular disorder Evaluation suggests symptoms are due to vertigo.  Neuro exam normal, no focal deficit.  Will trial use of meclizine , drowsiness precautions discussed. Recommend increased fluid intake to promote hydration, appears well hydrated on exam.  Orthostatic vital signs are unremarkable. Tolerating fluids, therefore candidate for oral hydration with water.  Staff will call if blood work is abnormal or requires change in treatment plan as discussed prior to discharge.  Recommend supportive care for symptomatic relief as outlined in AVS.   Counseled patient on potential for adverse effects with medications prescribed/recommended today, strict ER and return-to-clinic precautions discussed, patient verbalized understanding.    Final Clinical Impressions(s) / UC Diagnoses   Final diagnoses:  Benign paroxysmal positional vertigo due to bilateral vestibular disorder     Discharge Instructions      You have been evaluated today for dizziness. Your evaluation suggests that your symptoms are most likely due to vertigo.  You have been prescribed meclizine  to help relieve your symptoms. This medicine can make you sleepy, so do not take this and drive, drink alcohol, or go to work. Please take your prescription as directed.   Please follow up with your primary care provider for further management. Drink plenty of water to stay well hydrated.  If your symptoms do not improve in the next 2-3 days with interventions, please return. Return immediately for worsening or uncontrolled symptoms, worsening headache, chest pain, shortness of breath, persistent vomiting, vision changes, fainting, or for any other concerning symptoms.  I hope you feel better!    ED Prescriptions  Medication Sig Dispense Auth. Provider   meclizine  (ANTIVERT ) 25 MG tablet Take 1 tablet (25 mg total) by mouth 3 (three) times daily as needed for dizziness. 30 tablet Enedelia Dorna HERO, FNP      PDMP not  reviewed this encounter.   Enedelia Dorna HERO, [place] 08/15/23 1555

## 2023-08-29 DIAGNOSIS — R000 Tachycardia, unspecified: Secondary | ICD-10-CM | POA: Diagnosis not present

## 2023-12-01 ENCOUNTER — Ambulatory Visit: Attending: Internal Medicine | Admitting: Internal Medicine

## 2023-12-01 VITALS — BP 130/84 | HR 99 | Ht 64.0 in | Wt 171.0 lb

## 2023-12-01 DIAGNOSIS — K219 Gastro-esophageal reflux disease without esophagitis: Secondary | ICD-10-CM | POA: Diagnosis not present

## 2023-12-01 DIAGNOSIS — F419 Anxiety disorder, unspecified: Secondary | ICD-10-CM | POA: Diagnosis not present

## 2023-12-01 NOTE — Progress Notes (Signed)
 Cardiology Office Note:  .    Date:  12/01/2023  ID:  Janet Walsh, DOB 1965/02/21, MRN [id number] PCP: Rena Luke POUR, MD  [facility] Providers Cardiologist:  Stanly DELENA Leavens, MD     CC: CP f/u  History of Present Illness: .    Janet Walsh is a 58 y.o. female with GERD and panic attacks to establish care in 2024. Atypical CP see by Dayna in 2025.  Janet Walsh with GERD and panic attacks who presents with non-cardiac chest pain for follow-up.  She has a history of non-cardiac chest pain, typically occurring in the middle of her chest. Approximately two weeks ago, she experienced an episode with nausea, a sensation of heat and tingling starting from her feet and moving upwards, sweating, weakness, and a racing heart. During this episode, she also had chest pain and tingling in her arms, prompting her to call her niece for support due to fear of passing out. A similar episode occurred about a month prior.  She has undergone extensive cardiac evaluations, including a zero calcium score, normal ejection fraction, and multiple negative cardiac workups in 2014, 2019, 2022, 2023, 2024, and 2025. Recent tests included a heart monitor, echocardiogram, and cardiac CT.  She has a history of GERD, confirmed by endoscopy, and experiences acid reflux. Additionally, she mentions a cervical issue that sometimes causes pain and tingling down her arm and into her back, which exacerbates her anxiety and panic symptoms.  During the review of symptoms, she reported high blood pressure, which she attributes to anxiety. She also experiences tingling in her hands and weakness during episodes of chest pain.  Relevant histories: .  2014: negative cardiac work up. 2019: negative cardiac work up. 2022: Zero CAC score.  2023: Normal LVEF 2024: Established with me. DYAD Care: Me and Dayna Social  Had one son and does foster car ROS: As per HPI.   Studies Reviewed: .   Cardiac  Studies & Procedures   ______________________________________________________________________________________________   STRESS TESTS  EXERCISE TOLERANCE TEST (ETT) 03/30/2017  Interpretation Summary  The patient walked for a total of 5 minutes and 37 seconds on a Bruce protocol treadmill test. The peak heart rate was 179 which is 107% predicted maximal heart rate.  There were no ST or T wave changes to suggest ischemia. The blood pressure response to exercise was normal. There were no significant arrhythmias.  This is interpreted as a negative stress test. There is no evidence of ischemia.   ECHOCARDIOGRAM  ECHOCARDIOGRAM COMPLETE 04/24/2022  Narrative ECHOCARDIOGRAM REPORT    Patient Name:   Janet Walsh Date of Exam: 04/24/2022 Medical Rec #:  [id number]           Height:       64.0 in Accession #:    [id number]          Weight:       163.0 lb Date of Birth:  March 05, 1965           BSA:          1.793 m Patient Age:    56 years            BP:           116/68 mmHg Patient Gender: F                   HR:           66 bpm. Exam Location:  [facility]  Procedure:  2D Echo, Cardiac Doppler, Color Doppler and 3D Echo  Indications:    R07.9 Chest Pain  History:        Patient has prior history of Echocardiogram examinations, most recent 03/31/2021. Signs/Symptoms:Chest Pain, Dizziness/Lightheadedness and Syncope; Risk Factors:Family History of Coronary Artery Disease. Palpitaitons.  Sonographer:    Augustin Seals RDCS Referring Phys: [id number] TESSA N CONTE  IMPRESSIONS   1. Left ventricular ejection fraction, by estimation, is 60 to 65%. Left ventricular ejection fraction by 3D volume is 62 %. The left ventricle has normal function. The left ventricle has no regional wall motion abnormalities. Left ventricular diastolic parameters were normal. 2. Right ventricular systolic function is normal. The right ventricular size is normal. There is normal pulmonary artery systolic  pressure. The estimated right ventricular systolic pressure is 19.6 mmHg. 3. The mitral valve is normal in structure. Trivial mitral valve regurgitation. No evidence of mitral stenosis. 4. The aortic valve is normal in structure. Aortic valve regurgitation is not visualized. No aortic stenosis is present. 5. The inferior vena cava is normal in size with greater than 50% respiratory variability, suggesting right atrial pressure of 3 mmHg.  FINDINGS Left Ventricle: Left ventricular ejection fraction, by estimation, is 60 to 65%. Left ventricular ejection fraction by 3D volume is 62 %. The left ventricle has normal function. The left ventricle has no regional wall motion abnormalities. The left ventricular internal cavity size was normal in size. There is no left ventricular hypertrophy. Left ventricular diastolic parameters were normal. Normal left ventricular filling pressure.  Right Ventricle: The right ventricular size is normal. No increase in right ventricular wall thickness. Right ventricular systolic function is normal. There is normal pulmonary artery systolic pressure. The tricuspid regurgitant velocity is 2.04 m/s, and with an assumed right atrial pressure of 3 mmHg, the estimated right ventricular systolic pressure is 19.6 mmHg.  Left Atrium: Left atrial size was normal in size.  Right Atrium: Right atrial size was normal in size.  Pericardium: There is no evidence of pericardial effusion.  Mitral Valve: The mitral valve is normal in structure. Trivial mitral valve regurgitation. No evidence of mitral valve stenosis.  Tricuspid Valve: The tricuspid valve is normal in structure. Tricuspid valve regurgitation is trivial. No evidence of tricuspid stenosis.  Aortic Valve: The aortic valve is normal in structure. Aortic valve regurgitation is not visualized. No aortic stenosis is present.  Pulmonic Valve: The pulmonic valve was normal in structure. Pulmonic valve regurgitation is trivial.  No evidence of pulmonic stenosis.  Aorta: The aortic root is normal in size and structure.  Venous: The inferior vena cava is normal in size with greater than 50% respiratory variability, suggesting right atrial pressure of 3 mmHg.  IAS/Shunts: No atrial level shunt detected by color flow Doppler.   LEFT VENTRICLE PLAX 2D LVIDd:         4.10 cm         Diastology LVIDs:         2.90 cm         LV e' medial:    9.14 cm/s LV PW:         1.00 cm         LV E/e' medial:  9.7 LV IVS:        0.80 cm         LV e' lateral:   10.30 cm/s LVOT diam:     1.90 cm         LV E/e' lateral: 8.7 LV SV:  66 LV SV Index:   37 LVOT Area:     2.84 cm        3D Volume EF LV 3D EF:    Left ventricul ar ejection fraction by 3D volume is 62 %.  3D Volume EF: 3D EF:        62 % LV EDV:       95 ml LV ESV:       36 ml LV SV:        59 ml  RIGHT VENTRICLE RV Basal diam:  3.40 cm RV Mid diam:    2.30 cm RV S prime:     11.90 cm/s TAPSE (M-mode): 2.0 cm RVSP:           19.6 mmHg  LEFT ATRIUM             Index        RIGHT ATRIUM           Index LA diam:        3.10 cm 1.73 cm/m   RA Pressure: 3.00 mmHg LA Vol (A2C):   37.2 ml 20.74 ml/m  RA Area:     15.80 cm LA Vol (A4C):   32.2 ml 17.95 ml/m  RA Volume:   39.20 ml  21.86 ml/m LA Biplane Vol: 36.9 ml 20.57 ml/m AORTIC VALVE LVOT Vmax:   113.00 cm/s LVOT Vmean:  71.100 cm/s LVOT VTI:    0.234 m  AORTA Ao Root diam: 2.80 cm Ao Asc diam:  2.60 cm  MITRAL VALVE               TRICUSPID VALVE MV Area (PHT)  cm         TR Peak grad:   16.6 mmHg MV Decel Time: 246 msec    TR Vmax:        204.00 cm/s MV E velocity: 89.10 cm/s  Estimated RAP:  3.00 mmHg MV A velocity: 72.00 cm/s  RVSP:           19.6 mmHg MV E/A ratio:  1.24 SHUNTS Systemic VTI:  0.23 m Systemic Diam: 1.90 cm  Wilbert Bihari MD Electronically signed by Wilbert Bihari MD Signature Date/Time: 04/26/2022/7:13:58 PM    Final    MONITORS  LONG TERM MONITOR  (3-14 DAYS) 08/24/2023  Narrative   Patient had a minimum heart rate of 48 bpm, maximum heart rate of 164 bpm, and average heart rate of 72 bpm. Predominant underlying rhythm was sinus rhythm. Rare, SVT, longest at 11 seconds. Isolated PACs were rare (<1.0%). Isolated PVCs were rare (<1.0%). Triggered and diary events associated with sinus rhythm.  Asymptomatic paroxsymal SVT.   CT SCANS  CT CORONARY MORPH W/CTA COR W/SCORE 07/30/2023  Addendum 08/01/2023  8:10 PM ADDENDUM REPORT: 08/01/2023 20:08  EXAM: OVER-READ INTERPRETATION  CT CHEST  The following report is an over-read performed by radiologist Dr. Andrea Gasman of [facility] on 08/01/2023. This over-read does not include interpretation of cardiac or coronary anatomy or pathology. The coronary CTA interpretation by the cardiologist is attached.  COMPARISON:  Cardiac CT 05/20/2020  FINDINGS: Vascular: No aortic atherosclerosis. The included aorta is normal in caliber.  Mediastinum/nodes: No adenopathy or mass. Unremarkable esophagus.  Lungs: No focal airspace disease. No pulmonary nodule. No pleural fluid. The included airways are patent.  Upper abdomen: No acute findings.  Hepatic steatosis.  Musculoskeletal: There are no acute or suspicious osseous abnormalities.  IMPRESSION: Hepatic steatosis.   Electronically Signed By: Andrea  Sanford M.D. On: 08/01/2023 20:08  Narrative CLINICAL DATA:  Chest pain  EXAM: Cardiac CTA  MEDICATIONS: Sub lingual nitro. 4mg  x 2  TECHNIQUE: A non-contrast, gated CT scan was obtained with axial slices of 2.5 mm through the heart for calcium scoring. Calcium scoring was performed using the Agatston method. A 120 kV prospective, gated, contrast cardiac CT scan was obtained. Gantry rotation speed was 230 msec and collimation was 0.63 mm. Two sublingual nitroglycerin  tablets (0.8 mg) were given. The 3D data set was reconstructed with motion correction  for the best systolic or diastolic phase. Images were analyzed on a dedicated workstation using MPR, MIP, and VRT modes. The patient received 95 cc contrast.  FINDINGS: Non-cardiac: See separate report from [facility].  Normal caliber aortic root and ascending aorta. No LA appendage thrombus. Pulmonary veins drain normally to the left atrium.  Calcium Score: 0 Agatston units  Coronary Arteries: Right dominant with no anomalies  LM: No plaque or stenosis.  LAD system:  No plaque or stenosis.  Circumflex system: No plaque or stenosis.  RCA system:  No plaque or stenosis.  IMPRESSION: 1. Coronary artery calcium score 0 Agatston units, suggesting low risk for future cardiac events.  2.  No significant CAD noted on this study.  [employer]  Electronically Signed: By: Ezra Shuck M.D. On: 07/30/2023 16:32   CT SCANS  CT CARDIAC SCORING (SELF PAY ONLY) 05/20/2020  Addendum 05/20/2020 11:36 AM ADDENDUM REPORT: 05/20/2020 11:34  CLINICAL DATA:  Cardiovascular disease risk stratification  EXAM: CT Coronary Calcium Score  TECHNIQUE: A gated, non-contrast computed tomography scan of the heart was performed using 3mm slice thickness. Axial images were analyzed on a dedicated workstation. Calcium scoring of the coronary arteries was performed using the Agatston method.  FINDINGS: Coronary arteries: Normal origins.  Coronary Calcium Score:  Left main: 0  Left anterior descending artery: 0  Left circumflex artery: 0  Right coronary artery: 0. Small area in mid RCA which may represent calcification is under the detection threshold for calculation.  Total: 0  Pericardium: Normal.  Trivial posterior pericardial effusion.  Ascending Aorta: Normal caliber. Ascending aorta measures 26mm at the mid ascending aorta measured in an axial plane.  Non-cardiac: See separate report from [facility].  IMPRESSION: Coronary calcium score of  0.  RECOMMENDATIONS: Coronary artery calcium (CAC) score is a strong predictor of incident coronary heart disease (CHD) and provides predictive information beyond traditional risk factors. CAC scoring is reasonable to use in the decision to withhold, postpone, or initiate statin therapy in intermediate-risk or selected borderline-risk asymptomatic adults (age 92-75 years and LDL-C >=70 to <190 mg/dL) who do not have diabetes or established atherosclerotic cardiovascular disease (ASCVD).* In intermediate-risk (10-year ASCVD risk >=7.5% to <20%) adults or selected borderline-risk (10-year ASCVD risk >=5% to <7.5%) adults in whom a CAC score is measured for the purpose of making a treatment decision the following recommendations have been made:  If CAC=0, it is reasonable to withhold statin therapy and reassess in 5 to 10 years, as long as higher risk conditions are absent (diabetes mellitus, family history of premature CHD in first degree relatives (males <55 years; females <65 years), cigarette smoking, or LDL >=190 mg/dL).  If CAC is 1 to 99, it is reasonable to initiate statin therapy for patients >=19 years of age.  If CAC is >=100 or >=75th percentile, it is reasonable to initiate statin therapy at any age.  Cardiology referral should be considered for patients with CAC  scores >=400 or >=75th percentile.  *2018 AHA/ACC/AACVPR/AAPA/ABC/ACPM/ADA/AGS/APhA/ASPC/NLA/PCNA Guideline on the Management of Blood Cholesterol: A Report of the American College of Cardiology/American Heart Association Task Force on Clinical Practice Guidelines. J Am Coll Cardiol. 2019;73(24):3168-3209.   Electronically Signed By: Soyla Merck On: 05/20/2020 11:34  Narrative EXAM: OVER-READ INTERPRETATION  CT CHEST  The following report is an over-read performed by radiologist Dr. Franky Crease of [facility] on 05/20/2020. This over-read does not include interpretation of cardiac or  coronary anatomy or pathology. The coronary calcium score interpretation by the cardiologist is attached.  COMPARISON:  None.  FINDINGS: Vascular: Heart is normal size.  Aorta normal caliber.  Mediastinum/Nodes: No adenopathy.  Lungs/Pleura: No confluent opacities or effusions.  Upper Abdomen: Diffuse low-density throughout the visualized liver compatible with fatty infiltration.  Musculoskeletal: Chest wall soft tissues are unremarkable. No acute bony abnormality.  IMPRESSION: No acute extra cardiac abnormality.  Hepatic steatosis.  Electronically Signed: By: Franky Crease M.D. On: 05/20/2020 09:54     ______________________________________________________________________________________________       Physical Exam:    VS:  BP 130/84   Pulse 99   Ht 5' 4 (1.626 m)   Wt 171 lb (77.6 kg)   LMP 01/18/2016   SpO2 96%   BMI 29.35 kg/m    Wt Readings from Last 3 Encounters:  12/01/23 171 lb (77.6 kg)  07/12/23 165 lb 3.2 oz (74.9 kg)  06/28/23 166 lb (75.3 kg)    Gen: No distress Neck: No JVD Cardiac: No Rubs or Gallops, no Murmur, RRR +2 radial pulses Respiratory: Clear to auscultation bilaterally, normal effort, normal  respiratory rate GI: Soft, nontender, non-distended  MS: No edema;  moves all extremities Integument: Skin feels warm Neuro:  At time of evaluation, alert and oriented to person/place/time/situation  Psych: Anxious affect  ASSESSMENT AND PLAN: .    Non-cardiac chest pain Anxiety GERD Chronic non-cardiac chest pain with recent exacerbation. Extensive cardiac workup including heart monitor, echocardiogram, and cardiac CT show no evidence of cardiac pathology. Symptoms include nausea, sweating, weakness, and tingling, with episodes occurring approximately two weeks ago and again about a month later. Symptoms are consistent with panic attacks and GERD. No new cardiac findings on recent evaluations. Symptoms are real and not fabricated. -  Continue monitoring symptoms and ensure regular follow-up with Dayna and the provider. - Sent a message to Dr. Rena for further evaluation if symptoms persist despite feeling calm and comfortable.  One year with Dayna Two years with me   Stanly Leavens, MD FASE [facility] Cardiologist [facility]  [address] [place], [address] [place], [place] [address] [phone]  4:49 PM

## 2023-12-01 NOTE — Patient Instructions (Signed)
 Medication Instructions:   Your physician recommends that you continue on your current medications as directed. Please refer to the Current Medication list given to you today.   *If you need a refill on your cardiac medications before your next appointment, please call your pharmacy*   Lab Work: NONE ORDERED  TODAY    If you have labs (blood work) drawn today and your tests are completely normal, you will receive your results only by: MyChart Message (if you have MyChart) OR A paper copy in the mail If you have any lab test that is abnormal or we need to change your treatment, we will call you to review the results.    Testing/Procedures: NONE ORDERED  TODAY     Follow-Up: At [facility], you and your health needs are our priority.  As part of our continuing mission to provide you with exceptional heart care, our providers are all part of one team.  This team includes your primary Cardiologist (physician) and Advanced Practice Providers or APPs (Physician Assistants and Nurse Practitioners) who all work together to provide you with the care you need, when you need it.  Your next appointment: Dayna Dunn in 6 MONTHS  1 year(s)  Provider:   Stanly DELENA Leavens, MD    We recommend signing up for the patient portal called MyChart.  Sign up information is provided on this After Visit Summary.  MyChart is used to connect with patients for Virtual Visits (Telemedicine).  Patients are able to view lab/test results, encounter notes, upcoming appointments, etc.  Non-urgent messages can be sent to your provider as well.   To learn more about what you can do with MyChart, go to [email or web address].   Other Instructions

## 2024-02-09 ENCOUNTER — Other Ambulatory Visit: Payer: Self-pay

## 2024-02-09 ENCOUNTER — Encounter (HOSPITAL_COMMUNITY): Payer: Self-pay | Admitting: *Deleted

## 2024-02-09 ENCOUNTER — Ambulatory Visit (HOSPITAL_COMMUNITY)
Admission: EM | Admit: 2024-02-09 | Discharge: 2024-02-09 | Disposition: A | Discharge status: Home / Self Care | Attending: Family Medicine | Admitting: Family Medicine

## 2024-02-09 DIAGNOSIS — R6889 Other general symptoms and signs: Secondary | ICD-10-CM

## 2024-02-09 DIAGNOSIS — J069 Acute upper respiratory infection, unspecified: Secondary | ICD-10-CM

## 2024-02-09 DIAGNOSIS — Z20822 Contact with and (suspected) exposure to covid-19: Secondary | ICD-10-CM | POA: Diagnosis not present

## 2024-02-09 LAB — POC SOFIA SARS ANTIGEN FIA: SARS Coronavirus 2 Ag: NEGATIVE

## 2024-02-09 LAB — POCT INFLUENZA A/B
Influenza A, POC: NEGATIVE
Influenza B, POC: NEGATIVE

## 2024-02-09 MED ORDER — BENZONATATE 100 MG PO CAPS: ORAL_CAPSULE | ORAL | 0 refills | Status: AC

## 2024-02-09 NOTE — ED Triage Notes (Signed)
 PT reports since Sunday she has had a productive cough ,HA,sore throat, PT does not tol OTC for Sx's. Pt has only taken tylenol .

## 2024-02-10 NOTE — ED Provider Notes (Signed)
 " [facility]   [id number] 02/09/24 Arrival Time: 1649  ASSESSMENT & PLAN:  1. Viral URI with cough   2. Not feeling great   3. Exposure to COVID-19 virus    Child with + COVID test here today. Discussed typical duration of viral illness. Results for orders placed or performed during the hospital encounter of 02/09/24  POC SARS Coronavirus Ag   Collection Time: 02/09/24  6:32 PM  Result Value Ref Range   SARS Coronavirus 2 Ag Negative Negative  POC Influenza A/B   Collection Time: 02/09/24  6:32 PM  Result Value Ref Range   Influenza A, POC Negative Negative   Influenza B, POC Negative Negative   OTC symptom care as needed.  Meds ordered this encounter  Medications   benzonatate  (TESSALON ) 100 MG capsule    Sig: Take 1-2 capsule by mouth every 8 (eight) hours for cough.    Dispense:  30 capsule    Refill:  0     Follow-up Information     Rena Luke POUR, MD.   Specialty: Family Medicine Why: As needed. Contact information: [address] [place] [address] [phone]                 Reviewed expectations re: course of current medical issues. Questions answered. Outlined signs and symptoms indicating need for more acute intervention. Understanding verbalized. After Visit Summary given.   SUBJECTIVE: History from: Patient. Janet Walsh is a 59 y.o. female. PT reports since Sunday she has had a productive cough ,HA,sore throat, PT does not tol OTC for Sx's. Pt has only taken tylenol . Denies: difficulty breathing. Normal PO intake without n/v/d.  OBJECTIVE:  Vitals:   02/09/24 1748  BP: 138/83  Pulse: (!) 102  Resp: 20  Temp: 98.7 F (37.1 C)  SpO2: 94%    General appearance: alert; no distress Eyes: PERRLA; EOMI; conjunctiva normal HENT: [place]; AT; with nasal congestion Neck: supple  Lungs: speaks full sentences without difficulty; unlabored; active cough Extremities: no edema Skin: warm and  dry Neurologic: normal gait Psychological: alert and cooperative; normal mood and affect  Labs: Results for orders placed or performed during the hospital encounter of 02/09/24  POC SARS Coronavirus Ag   Collection Time: 02/09/24  6:32 PM  Result Value Ref Range   SARS Coronavirus 2 Ag Negative Negative  POC Influenza A/B   Collection Time: 02/09/24  6:32 PM  Result Value Ref Range   Influenza A, POC Negative Negative   Influenza B, POC Negative Negative   Labs Reviewed  POC SOFIA SARS ANTIGEN FIA  POCT INFLUENZA A/B    Imaging: No results found.  Allergies[1]  Past Medical History:  Diagnosis Date   Depression    Migraines    Panic attack    Panic attacks    Vertigo    Social History   Socioeconomic History   Marital status: Single    Spouse name: Not on file   Number of children: Not on file   Years of education: Not on file   Highest education level: Not on file  Occupational History   Not on file  Tobacco Use   Smoking status: Never   Smokeless tobacco: Never  Vaping Use   Vaping status: Never Used  Substance and Sexual Activity   Alcohol use: Yes    Comment: occasionally   Drug use: No   Sexual activity: Not Currently    Birth control/protection: None, Post-menopausal  Other  Topics Concern   Not on file  Social History Narrative   Not on file   Social Drivers of Health   Tobacco Use: Low Risk (02/09/2024)   Patient History    Smoking Tobacco Use: Never    Smokeless Tobacco Use: Never    Passive Exposure: Not on file  Financial Resource Strain: Low Risk (07/20/2023)   Received from [facility]   Overall Financial Resource Strain (CARDIA)    Difficulty of Paying Living Expenses: Not hard at all  Food Insecurity: No Food Insecurity (07/20/2023)   Received from [facility]   Epic    Within the past 12 months, you worried that your food would run out before you got the money to buy more.: Never true    Within the past 12 months, the food you  bought just didn't last and you didn't have money to get more.: Never true  Transportation Needs: No Transportation Needs (07/20/2023)   Received from [facility]    Lack of Transportation (Non-Medical): No    Lack of Transportation (Medical): No  Physical Activity: Not on file  Stress: Not on file  Social Connections: Not on file  Intimate Partner Violence: Not on file  Depression (EYV7-0): Not on file  Alcohol Screen: Not on file  Housing: Low Risk (07/20/2023)   [facility]   Epic    At any time in the past 12 months, were you homeless or living in a shelter (including now)?: No    In the past 12 months, how many times have you moved where you were living?: 0    In the last 12 months, was there a time when you were not able to pay the mortgage or rent on time?: No  Utilities: Not At Risk (07/20/2023)   Received from [facility]ities    Threatened with loss of utilities: No  Health Literacy: Not on file   Family History  Problem Relation Age of Onset   Hypertension Mother    Hyperlipidemia Mother    High blood pressure Sister    Heart disease Maternal Uncle    Past Surgical History:  Procedure Laterality Date   CESAREAN SECTION  1998   DILATION AND CURETTAGE OF UTERUS     HARDWARE REMOVAL Left 05/26/2023   Procedure: REMOVAL, HARDWARE;  Surgeon: Barton Drape, MD;  Location: [facility];  Service: Orthopedics;  Laterality: Left;   ORIF ANKLE FRACTURE Left 03/04/2022   Procedure: Left ankle lateral malleolus ORIF, possible syndesmosis and/or deltoid fixation, possible allograft;  Surgeon: Barton Drape, MD;  Location: [facility];  Service: Orthopedics;  Laterality: Left;   TENDON REPAIR Left 05/26/2023   Procedure: TENDON DEBRIDEMENT;  Surgeon: Barton Drape, MD;  Location: [facility];  Service: Orthopedics;  Laterality: Left;  possible peroneal tendon debridement with  possible repair   UTERINE FIBROID SURGERY        [1]  Allergies Allergen Reactions   Nyquil Cough Dm + Congestion [Phenylephrine -Doxylamine-Dm] Other (See Comments)    Syncope   Tape Other (See Comments)    EKG leads- When removed, leave some redness/irritation behind     Rolinda Rogue, MD 02/10/24 0818  "
